# Patient Record
Sex: Male | Born: 1975 | Race: White | Hispanic: No | Marital: Married | Smoking: Former smoker
Health system: Southern US, Community
[De-identification: ages and names within clinical notes are randomized; demographics above are authoritative.]

## PROBLEM LIST (undated history)

## (undated) DIAGNOSIS — I10 Essential (primary) hypertension: Secondary | ICD-10-CM

## (undated) DIAGNOSIS — E079 Disorder of thyroid, unspecified: Secondary | ICD-10-CM

## (undated) HISTORY — PX: CERVICAL FUSION: SHX112

---

## 2007-12-13 ENCOUNTER — Encounter: Payer: Self-pay | Admitting: Orthopedic Surgery

## 2008-01-03 ENCOUNTER — Encounter: Payer: Self-pay | Admitting: Orthopedic Surgery

## 2008-12-07 ENCOUNTER — Observation Stay: Payer: Self-pay | Admitting: Cardiovascular Disease

## 2010-02-02 ENCOUNTER — Ambulatory Visit: Payer: Self-pay | Admitting: Family Medicine

## 2010-02-02 DIAGNOSIS — M109 Gout, unspecified: Secondary | ICD-10-CM

## 2010-02-02 DIAGNOSIS — F1010 Alcohol abuse, uncomplicated: Secondary | ICD-10-CM | POA: Insufficient documentation

## 2010-02-02 DIAGNOSIS — F329 Major depressive disorder, single episode, unspecified: Secondary | ICD-10-CM

## 2010-02-02 DIAGNOSIS — I10 Essential (primary) hypertension: Secondary | ICD-10-CM | POA: Insufficient documentation

## 2010-02-02 DIAGNOSIS — K219 Gastro-esophageal reflux disease without esophagitis: Secondary | ICD-10-CM

## 2010-02-02 DIAGNOSIS — F17200 Nicotine dependence, unspecified, uncomplicated: Secondary | ICD-10-CM

## 2010-02-03 LAB — CONVERTED CEMR LAB
CO2: 22 meq/L (ref 19–32)
Calcium: 9.7 mg/dL (ref 8.4–10.5)
Sodium: 138 meq/L (ref 135–145)
Uric Acid, Serum: 5 mg/dL (ref 4.0–7.8)

## 2010-02-09 ENCOUNTER — Telehealth: Payer: Self-pay | Admitting: Family Medicine

## 2010-02-10 ENCOUNTER — Telehealth (INDEPENDENT_AMBULATORY_CARE_PROVIDER_SITE_OTHER): Payer: Self-pay | Admitting: Family Medicine

## 2010-09-03 NOTE — Progress Notes (Signed)
  Phone Note Call from Patient   Caller: Patient Reason for Call: Talk to Doctor Details of Complaint: The patient called and wanted something else for his gout symptoms, Dr. Thurmond Butts called in a prescription for Colchidine.  The patient went to pick it up and it was too expensive.  He wanted to know if the doctor would call in anther prescription for Indocin. Initial call taken by: Rosine Beat,  February 10, 2010 1:15 PM  Follow-up for Phone Call        called patient, left message to return call Follow-up by: Adella Hare LPN,  February 10, 2010 1:32 PM  Additional Follow-up for Phone Call Additional follow up Details #1::        this is no longer patient's phone number, he has moved, this is his fathers home# Additional Follow-up by: Adella Hare LPN,  February 11, 2010 1:34 PM    New/Updated Medications: INDOMETHACIN CAP 25MG  (INDOMETHACIN) TAKE 1-2 PO 3 TIMES A DAY W/FOOD PRN Prescriptions: INDOMETHACIN CAP 25MG  (INDOMETHACIN) TAKE 1-2 PO 3 TIMES A DAY W/FOOD PRN  #60 x 0   Entered and Authorized by:   Kathrynn Running MD   Signed by:   Kathrynn Running MD on 02/10/2010   Method used:   Electronically to        [facility] [address] (retail)       [address]       [place], [place]  [address]       Ph: [phone]       Fax: [phone]   RxID:   [id number]

## 2010-09-03 NOTE — Progress Notes (Signed)
  Phone Note Call from Patient   Reason for Call: Talk to Doctor Details of Complaint: He was seen last weekend with gout, and was prescribed Indomethacin, called and left message this am and stated the Indomethcin was not helping.  He was in alot of pain.  He is asking what should he do?? Initial call taken by: Rosine Beat,  February 09, 2010 11:25 AM    New/Updated Medications: COLCHICINE 0.6 MG TABS (COLCHICINE) sig 1 by mouth qday to 3x a day for gout . Reduce doseage if diarrhea  occurs. Prescriptions: COLCHICINE 0.6 MG TABS (COLCHICINE) sig 1 by mouth qday to 3x a day for gout . Reduce doseage if diarrhea  occurs.  #30 x 0   Entered and Authorized by:   Hassan Rowan MD   Signed by:   Hassan Rowan MD on 02/09/2010   Method used:   Electronically to        [facility] [address] (retail)       [address]       [place], [place]  [address]       Ph: [phone]       Fax: [phone]   RxID:   [id number] COLCHICINE 0.6 MG TABS (COLCHICINE) sig 1 by mouth qday to 3x a day for gout . Reduce doseage if diarrhea  occurs.  #30 x 0   Entered and Authorized by:   Hassan Rowan MD   Signed by:   Hassan Rowan MD on 02/09/2010   Method used:   Electronically to        [facility] [address] (retail)       [address]       [place], [place]  [address]       Ph: [phone]       Fax: [phone]   RxID:   [id number]  Patient may need to be reevaluated if not better in one week. He was given instructions then to check w/PCP . Two options 1 to be reevaluated here or w/PCP. Or we can try colchine w/ the indocin to help the gout.

## 2010-09-03 NOTE — Assessment & Plan Note (Signed)
Summary: INJURED LEFT FOOT/EVM   Vital Signs:  Patient Profile:   35 Years Old Male CC:      wednesday Left foot started hurting// can not remember injury Height:     74 inches Weight:      214 pounds BMI:     27.58 Temp:     98.6 degrees F oral Pulse rate:   80 / minute Pulse rhythm:   regular Resp:     16 per minute BP sitting:   144 / 86  (left arm) Cuff size:   large  Vitals Entered By: Providence Crosby LPN (February 03, 6212 12:07 PM)                  Current Allergies: No known allergies History of Present Illness History from: patient Reason for visit: left foot pain Chief Complaint: wednesday Left foot started hurting// no njury History of Present Illness: Pain started wednesday with some swelling and redness, pain and swelling is getting worse.  Pt reports that he is having a hard time walking because of pain in the 1st toe joint in the left foot.  Pt says it hurts to put even a small amount of pressure on the foot.  Pt reports that he has had no fever chills, no injury to the foot, no nausea or vomiting.  He reports that he took some naproxen and it helped some with the pain. Pt in a lot of pain today. That is why he came in to be evaluated. He says that he is concerned about it getting worse over the next 2 days. the patient reports that was no particular injury that occurred on the foot. He reports that he wasn't sure that he needed to come in immediately but because the symptoms have gotten worse he wanted to have this evaluation.  the patient says that he does drink a lot of alcohol but he's not an alcoholic and he is able to stop whenever he needs to. He says that his family drinks a lot of alcohol and he grew up around excess consumption of alcohol. He says that he is not dependent on alcohol.  Current Problems: NICOTINE ADDICTION (ICD-305.1) DEHYDRATION (ICD-276.51) DEPRESSION (ICD-311) GERD (ICD-530.81) HYPERTENSION (ICD-401.9) DEPRESSION (ICD-311) UNSPECIFIED ESSENTIAL  HYPERTENSION (ICD-401.9) ACUTE GOUTY ARTHROPATHY (ICD-274.01) ALCOHOL ABUSE (ICD-305.00)   Current Meds ZANTAC 150 MG TABS (RANITIDINE HCL) 1 two times a day  by mouth ATENOLOL 100 MG TABS (ATENOLOL) take 1 by mouth daily HYDROCHLOROTHIAZIDE 25 MG TABS (HYDROCHLOROTHIAZIDE) takes 10 mg daily per patient by mouth FLUOXETINE HCL 40 MG CAPS (FLUOXETINE HCL) 1 daily by mouth INDOMETHACIN 50 MG CAPS (INDOMETHACIN)   REVIEW OF SYSTEMS Constitutional Symptoms      Denies fever, chills, night sweats, weight loss, weight gain, and fatigue.  Eyes       Complains of eye surgery.      Denies change in vision, eye pain, eye discharge, glasses, and contact lenses. Ear/Nose/Throat/Mouth       Denies hearing loss/aids, change in hearing, ear pain, ear discharge, dizziness, frequent runny nose, frequent nose bleeds, sinus problems, sore throat, hoarseness, and tooth pain or bleeding.  Respiratory       Denies dry cough, productive cough, wheezing, shortness of breath, asthma, bronchitis, and emphysema/COPD.  Cardiovascular       Complains of chest pain.      Denies murmurs and tires easily with exhertion.      Comments: chest pain in the past   Gastrointestinal  Complains of indigestion.      Denies stomach pain, nausea/vomiting, diarrhea, constipation, and blood in bowel movements. Genitourniary       Denies painful urination, blood or discharge from penis, kidney stones, and loss of urinary control. Neurological       Complains of tingling and tremors.      Denies paralysis, seizures, and fainting/blackouts. Musculoskeletal       Complains of gout.      Denies muscle pain, joint pain, joint stiffness, decreased range of motion, redness, swelling, and muscle weakness.      Comments: maybe per patient Skin       Denies bruising, unusual mles/lumps or sores, and hair/skin or nail changes.  Psych       Complains of anxiety/stress and depression.      Denies mood changes, temper/anger issues,  speech problems, and sleep problems.  Past History:  Family History: Last updated: 02/02/2010 Mother: alive 71yoa Lung Cancer Father: alive at 20 open heart surgery Brother: alive and well  Social History: Last updated: 02/02/2010 Marital Status: Married Children:  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 CANCER Removal: 2003 Lasik 01/12/2002  Past Medical History: Depression Hypertension heavy alcohol use chronic active nicotine dependence GERD History of skin cancer  Past Surgical History: skin cancer removal 2003 LASIK 2011  Family History: Mother: alive 77yoa Lung Cancer Father: alive at 43 open heart surgery Brother: alive and well  Social History: Marital Status: Married Children:  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 CANCER Removal: 2003 Lasik 01/12/2002 Physical Exam General appearance: well developed, well nourished, no acute distress Head: normocephalic, atraumatic Eyes: conjunctivae and lids normal Pupils: equal, round, reactive to light Ears: normal, no lesions or deformities Nasal: mucosa pink, nonedematous, no septal deviation, turbinates normal Oral/Pharynx: tongue normal, posterior pharynx without erythema or exudate Neck: neck supple,  trachea midline, no masses Chest/Lungs: no rales, wheezes, or rhonchi bilateral, breath sounds equal without effort Heart: regular rate and  rhythm, no murmur Abdomen: soft, non-tender without obvious organomegaly Extremities: patient has a severely swollen red and tender left first MCP joint, only a minimal amount of movement of the right first toe because of tenderness. He also has severe pain and tenderness on the pad of the first toe. No onychomycosis. Neurological: patient is walking with a limp, cannot bear weight on the left foot. Otherwise neurological exam normal Skin: redness and irritation and inflammation and heat in the left first toe MSE: oriented to time, place, and person Assessment Problems:     Assessed ACUTE GOUTY ARTHROPATHY as new - Standley Dakins MD New Problems: NICOTINE ADDICTION (ICD-305.1) DEHYDRATION (ICD-276.51) DEPRESSION (ICD-311) GERD (ICD-530.81) HYPERTENSION (ICD-401.9) DEPRESSION (ICD-311) UNSPECIFIED ESSENTIAL HYPERTENSION (ICD-401.9) ACUTE GOUTY ARTHROPATHY (ICD-274.01) ALCOHOL ABUSE (ICD-305.00)   Patient Education: Patient and/or caregiver instructed in the following: rest, quit smoking. please cut down alcohol usage significantly-please cut at least 50% of alcohol consumption. Demonstrates willingness to comply.  Plan New Medications/Changes: INDOCIN 50 MG SUPP (INDOMETHACIN) take 1 by mouth three times a day as needed severe gout pain, take with food and drink lots of water  #18 x 0, 02/02/2010, Standley Dakins MD  New Orders: New Patient Level II [99202] Basic Met-[facility] [phone] Uric Acid-[facility] [22025-42706] Follow Up: Follow up in 1-2 days if no improvement, Follow up on an as needed basis, Follow up with Primary Physician  The patient and/or caregiver has been counseled thoroughly with regard to medications prescribed including dosage, schedule, interactions, rationale for use, and possible side effects and they verbalize  understanding.  Diagnoses and expected course of recovery discussed and will return if not improved as expected or if the condition worsens. Patient and/or caregiver verbalized understanding.  Prescriptions: INDOCIN 50 MG SUPP (INDOMETHACIN) take 1 by mouth three times a day as needed severe gout pain, take with food and drink lots of water  #18 x 0   Entered and Authorized by:   Standley Dakins MD   Signed by:   Standley Dakins MD on 02/02/2010   Method used:   Electronically to        [facility] [address])       [address], [address]       [place], [place]  [address]       Ph: [phone]       Fax: [phone]   RxID:   [id number]  The patient was actually given  a prescription for the capsules. I was called the pharmacy and to make sure that they gave him capsules and not suppositories.  Patient Instructions: 1)  Stop the HCTZ completely until your gout gets better and check with your PCP before restarting.  2)  Cut down on alcohol consumption by at least 50%. 3)  Avoid Aspirin, liver and foods that increase uric acid levels. 4)  Drink extra water because you are dehydrated 5)  Stop taking indocin as soon as you get better. Don't take the indocin for longer than 3 days.   Orders Added: 1)  New Patient Level II [99202] 2)  Basic Met[facility] [84132-44010] 3)  Uric Acid[facility] [27253-66440]  The risks, benefits and possible side effects were clearly explained and discussed with the patient.  The patient verbalized clear understanding.  The patient was given instructions to return if symptoms don't improve, worsen or new changes develop.  If it is not during clinic hours and the patient cannot get back to this clinic then the patient was told to seek medical care at an available urgent care or emergency department.  The patient verbalized understanding.    The patient was informed that there is no on-call provider or services available at this clinic during off-hours (when the clinic is closed).  If the patient developed a problem or concern that required immediate attention, the patient was advised to go the the nearest available urgent care or emergency department for medical care.  The patient verbalized understanding.     It was clearly explained to the patient that this [facility] is not intended to be a primary care clinic.  The patient is always better served by the continuity of care and the provider/patient relationships developed with their dedicated primary care provider.  The patient was told to be sure to follow up as soon as possible with their primary care provider to discuss treatments received and to receive further examination and testing.  The  patient verbalized understanding. The will f/u with PCP ASAP.   The patient's prescriptions were checked for possible interactions and electronically sent to the pharmacy of choice.

## 2012-07-25 ENCOUNTER — Emergency Department: Payer: Self-pay | Admitting: Emergency Medicine

## 2012-07-25 LAB — URINALYSIS, COMPLETE
Bacteria: NONE SEEN
Bilirubin,UR: NEGATIVE
Blood: NEGATIVE
Glucose,UR: NEGATIVE mg/dL (ref 0–75)
Ketone: NEGATIVE
Nitrite: NEGATIVE
Specific Gravity: 1.017 (ref 1.003–1.030)
WBC UR: 1 /HPF (ref 0–5)

## 2012-07-27 LAB — AFP TUMOR MARKER: AFP-Tumor Marker: 3 ng/mL (ref 0.0–8.3)

## 2013-05-17 ENCOUNTER — Ambulatory Visit: Payer: Self-pay | Admitting: Family Medicine

## 2014-01-06 DIAGNOSIS — M5412 Radiculopathy, cervical region: Secondary | ICD-10-CM | POA: Insufficient documentation

## 2014-01-06 DIAGNOSIS — M7580 Other shoulder lesions, unspecified shoulder: Secondary | ICD-10-CM | POA: Insufficient documentation

## 2014-01-19 ENCOUNTER — Ambulatory Visit: Payer: Self-pay | Admitting: Nurse Practitioner

## 2014-02-15 ENCOUNTER — Encounter: Payer: Self-pay | Admitting: Orthopedic Surgery

## 2014-03-04 ENCOUNTER — Encounter: Payer: Self-pay | Admitting: Orthopedic Surgery

## 2014-06-06 DIAGNOSIS — M5032 Other cervical disc degeneration, mid-cervical region, unspecified level: Secondary | ICD-10-CM | POA: Insufficient documentation

## 2014-06-20 ENCOUNTER — Encounter: Payer: Self-pay | Admitting: Orthopedic Surgery

## 2014-07-04 ENCOUNTER — Encounter: Payer: Self-pay | Admitting: Orthopedic Surgery

## 2015-07-16 ENCOUNTER — Ambulatory Visit: Payer: 59

## 2015-07-16 ENCOUNTER — Encounter: Payer: Self-pay | Admitting: Emergency Medicine

## 2015-07-16 ENCOUNTER — Ambulatory Visit
Admission: EM | Admit: 2015-07-16 | Discharge: 2015-07-16 | Disposition: A | Discharge status: Home / Self Care | Payer: 59 | Attending: Family Medicine | Admitting: Family Medicine

## 2015-07-16 ENCOUNTER — Ambulatory Visit (INDEPENDENT_AMBULATORY_CARE_PROVIDER_SITE_OTHER): Payer: 59

## 2015-07-16 DIAGNOSIS — S20211A Contusion of right front wall of thorax, initial encounter: Secondary | ICD-10-CM

## 2015-07-16 HISTORY — DX: Essential (primary) hypertension: I10

## 2015-07-16 MED ORDER — IBUPROFEN 800 MG PO TABS: 800.0000 mg | ORAL_TABLET | Freq: Three times a day (TID) | ORAL | Status: DC | PRN

## 2015-07-16 MED ORDER — OXYCODONE-ACETAMINOPHEN 5-325 MG PO TABS: 1.0000 | ORAL_TABLET | Freq: Three times a day (TID) | ORAL | Status: DC | PRN

## 2015-07-16 NOTE — ED Notes (Signed)
Patient states that he was wrestling with his wife for fun on Saturday and felt a pop on his right ribcage.  Patient reports pain when he coughs or takes a deep breath.

## 2015-07-16 NOTE — ED Provider Notes (Signed)
[facility]  ____________________________________________  Time seen: Approximately 9:07 AM  I have reviewed the triage vital signs and the nursing notes.   HISTORY  Chief Complaint Rib Pain    HPI Shawn Wilson is a 39 y.o. male presents for complaints of right lateral rib pain. Patient reports that Saturday afternoon he was wrestling with his wife "goofing off "and states that his right rib accidentally hit her knee. States that he heard a pop and has had pain present to that area since. Denies other injury. Denies fall. Denies head injury or loss consciousness.  States current right lateral rib pain is 1 out of 10 however with movement, cough, sneeze, deep breath pain is 7 out of 10 sharp. Denies pain radiation. States pain is fully reproducible by touch.  Denies shortness of breath, cough, fever, abdominal pain, neck or back pain.  PCP: Clemmie Krill  Past Medical History  Diagnosis Date  . Hypertension     Patient Active Problem List   Diagnosis Date Noted  . ACUTE GOUTY ARTHROPATHY 02/02/2010  . ALCOHOL ABUSE 02/02/2010  . NICOTINE ADDICTION 02/02/2010  . DEPRESSION 02/02/2010  . Unspecified essential hypertension 02/02/2010  . GERD 02/02/2010    Past Surgical History  Procedure Laterality Date  . Cervical fusion      Current Outpatient Rx  Name  Route  Sig  Dispense  Refill  . lisinopril (PRINIVIL,ZESTRIL) 20 MG tablet   Oral   Take 20 mg by mouth daily.           Allergies Review of patient's allergies indicates no known allergies.  History reviewed. No pertinent family history.  Social History Social History  Substance Use Topics  . Smoking status: Former Research scientist (life sciences)  . Smokeless tobacco: None  . Alcohol Use: Yes    Review of Systems Constitutional: No fever/chills Eyes: No visual changes. ENT: No sore throat. Cardiovascular: Denies chest pain. Respiratory: Denies shortness of breath. Gastrointestinal: No abdominal pain.  No nausea, no  vomiting.  No diarrhea.  No constipation. Genitourinary: Negative for dysuria. Musculoskeletal: Negative for back pain. right lateral rib pain Skin: Negative for rash. Neurological: Negative for headaches, focal weakness or numbness.  10-point ROS otherwise negative.  ____________________________________________   PHYSICAL EXAM:  VITAL SIGNS: ED Triage Vitals  Enc Vitals Group     BP 07/16/15 0848 138/76 mmHg     Pulse Rate 07/16/15 0848 74     Resp 07/16/15 0848 16     Temp 07/16/15 0848 96.7 F (35.9 C)     Temp Source 07/16/15 0848 Tympanic     SpO2 07/16/15 0848 100 %     Weight 07/16/15 0848 200 lb (90.719 kg)     Height 07/16/15 0848 6\' 2"  (1.88 m)     Head Cir --      Peak Flow --      Pain Score 07/16/15 0850 7     Pain Loc --      Pain Edu? --      Excl. in [place]? --     Constitutional: Alert and oriented. Well appearing and in no acute distress. Eyes: Conjunctivae are normal. PERRL. EOMI. Head: Atraumatic  Nose: No congestion/rhinnorhea.  Mouth/Throat: Mucous membranes are moist.  Oropharynx non-erythematous. Neck: No stridor.  No cervical spine tenderness to palpation. Hematological/Lymphatic/Immunilogical: No cervical lymphadenopathy. Cardiovascular: Normal rate, regular rhythm. Grossly normal heart sounds.  Good peripheral circulation. Respiratory: Normal respiratory effort.  No retractions. Lungs CTAB. no wheezes, rales or rhonchi. Good air movement.  Gastrointestinal: Soft and nontender. No distention. Normal Bowel sounds.  Musculoskeletal: No lower or upper extremity tenderness nor edema.  No joint effusions. Bilateral pedal pulses equal and easily palpated.  No cervical, thoracic or lumbar tenderness to palpation. Changes position from lying to standing quickly. Right lateral rib midaxillary line moderate tenderness to palpation over right fourth through seventh ribs, no ecchymosis, no swelling. No palpable deformity. Chest otherwise nontender. Neurologic:   Normal speech and language. No gross focal neurologic deficits are appreciated. No gait instability. Skin:  Skin is warm, dry and intact. No rash noted. Psychiatric: Mood and affect are normal. Speech and behavior are normal.  ____________________________________________   LABS (all labs ordered are listed, but only abnormal results are displayed)  Labs Reviewed - No data to display  RADIOLOGY  RIGHT RIBS AND CHEST - 3+ VIEW  COMPARISON: None.  FINDINGS: No fracture or other bone lesions are seen involving the ribs. There is no evidence of pneumothorax or pleural effusion. Both lungs are clear. Heart size and mediastinal contours are within normal limits.  IMPRESSION: Negative.   Electronically Signed By: Kathreen Devoid On: 07/16/2015 09:47  I, Marylene Land, personally viewed and evaluated these images (plain radiographs) as part of my medical decision making.    INITIAL IMPRESSION / ASSESSMENT AND PLAN / ED COURSE  Pertinent labs & imaging results that were available during my care of the patient were reviewed by me and considered in my medical decision making (see chart for details).  Very well-appearing patient. No acute distress. Presents for the complaint of right lateral rib pain post injury Saturday. Denies pain prior to injury. States pain was immediately after onset of hitting right rib on wife's knee. Denies other pain or injury. Pain fully reproducible by direct palpation per patient. Lungs clear throughout. Abdomen soft and nontender. Will evaluate by right rib series.  Rib xray negative per radiologist. Counseled regarding ice, rest, deep breaths throughout day frequently, avoid reinjury. PRN ibuprofen and percocet qty 9. Work note given. Counseled if pain persists then repeat imaging in 10-14 days.   Discussed follow up with Primary care physician this week. Discussed follow up and return parameters including no resolution or any worsening concerns.  Patient verbalized understanding and agreed to plan.   ____________________________________________   FINAL CLINICAL IMPRESSION(S) / ED DIAGNOSES  Final diagnoses:  Rib contusion, right, initial encounter       Marylene Land, NP 07/16/15 1150

## 2015-07-16 NOTE — Discharge Instructions (Signed)
Take medication as prescribed. Rest. Apply ice. Take deep breaths multiple times per day as discussed, to help with lung expansion.   Follow up with your primary care physician this week as needed. Return to Urgent care as needed for new or worsening concerns.   Rib Contusion A rib contusion is a deep bruise on your rib area. Contusions are the result of a blunt trauma that causes bleeding and injury to the tissues under the skin. A rib contusion may involve bruising of the ribs and of the skin and muscles in the area. The skin overlying the contusion may turn blue, purple, or yellow. Minor injuries will give you a painless contusion, but more severe contusions may stay painful and swollen for a few weeks. CAUSES  A contusion is usually caused by a blow, trauma, or direct force to an area of the body. This often occurs while playing contact sports. SYMPTOMS  Swelling and redness of the injured area.  Discoloration of the injured area.  Tenderness and soreness of the injured area.  Pain with or without movement. DIAGNOSIS  The diagnosis can be made by taking a medical history and performing a physical exam. An X-ray, CT scan, or MRI may be needed to determine if there were any associated injuries, such as broken bones (fractures) or internal injuries. TREATMENT  Often, the best treatment for a rib contusion is rest. Icing or applying cold compresses to the injured area may help reduce swelling and inflammation. Deep breathing exercises may be recommended to reduce the risk of partial lung collapse and pneumonia. Over-the-counter or prescription medicines may also be recommended for pain control. HOME CARE INSTRUCTIONS   Apply ice to the injured area:  Put ice in a plastic bag.  Place a towel between your skin and the bag.  Leave the ice on for 20 minutes, 2-3 times per day.  Take medicines only as directed by your health care provider.  Rest the injured area. Avoid strenuous activity  and any activities or movements that cause pain. Be careful during activities and avoid bumping the injured area.  Perform deep-breathing exercises as directed by your health care provider.  Do not lift anything that is heavier than 5 lb (2.3 kg) until your health care provider approves.  Do not use any tobacco products, including cigarettes, chewing tobacco, or electronic cigarettes. If you need help quitting, ask your health care provider. SEEK MEDICAL CARE IF:   You have increased bruising or swelling.  You have pain that is not controlled with treatment.  You have a fever. SEEK IMMEDIATE MEDICAL CARE IF:   You have difficulty breathing or shortness of breath.  You develop a continual cough, or you cough up thick or bloody sputum.  You feel sick to your stomach (nauseous), you throw up (vomit), or you have abdominal pain.   This information is not intended to replace advice given to you by your health care provider. Make sure you discuss any questions you have with your health care provider.   Document Released: 04/15/2001 Document Revised: 08/11/2014 Document Reviewed: 05/02/2014 Elsevier Interactive Patient Education [employer].

## 2015-07-22 ENCOUNTER — Ambulatory Visit (INDEPENDENT_AMBULATORY_CARE_PROVIDER_SITE_OTHER): Payer: 59

## 2015-07-22 ENCOUNTER — Encounter: Payer: Self-pay | Admitting: Gynecology

## 2015-07-22 ENCOUNTER — Ambulatory Visit
Admission: EM | Admit: 2015-07-22 | Discharge: 2015-07-22 | Disposition: A | Discharge status: Home / Self Care | Payer: 59 | Attending: Family Medicine | Admitting: Family Medicine

## 2015-07-22 DIAGNOSIS — S2231XS Fracture of one rib, right side, sequela: Secondary | ICD-10-CM

## 2015-07-22 MED ORDER — NAPROXEN 500 MG PO TABS: 500.0000 mg | ORAL_TABLET | Freq: Two times a day (BID) | ORAL | Status: DC

## 2015-07-22 MED ORDER — CYCLOBENZAPRINE HCL 5 MG PO TABS: ORAL_TABLET | ORAL | Status: DC

## 2015-07-22 NOTE — ED Notes (Signed)
Patient c/o follow up visit x 1 week for rib pain. Per patient pain is not better.

## 2015-07-22 NOTE — ED Provider Notes (Signed)
Patient presents today for follow-up regarding rib injury a week ago. Patient states that his symptoms have not improved much. He is still having pain with movement and coughing or laughing. The injury occurred due to a knee to the right rib area from his wife's knee while "goofing off." He denies any chest pain, shortness of breath, fever, productive cough. He has been taking ibuprofen and Percocet.  ROS: Negative except mentioned above.  Vitals: as per Epic. GENERAL: NAD HEENT: no pharyngeal erythema, no exudate RESP: CTA B, mild tenderness of right lower anterior lateral rib area, no step-off, no ecchymosis appreciated   CARD: RRR  A/P: Nondisplaced right eighth rib fracture-this was not seen on previous x-ray that was done a few days ago, this was explained to the patient, will prescribe Naprosyn and Flexeril when necessary, ice when necessary, advised taking deep breaths in and out, we'll give patient work excuse for 2 days, if a longer period of time as needed he will need to follow-up with his primary care physician. If any acute problems he is to seek medical attention.   Paulina Fusi, MD 07/22/15 [phone]

## 2016-12-10 ENCOUNTER — Ambulatory Visit: Admission: EM | Admit: 2016-12-10 | Discharge: 2016-12-10 | Discharge status: Absent without leave | Payer: 59

## 2016-12-15 ENCOUNTER — Ambulatory Visit: Admission: EM | Admit: 2016-12-15 | Discharge: 2016-12-15 | Discharge status: Absent without leave | Payer: 59

## 2016-12-16 ENCOUNTER — Ambulatory Visit
Admission: EM | Admit: 2016-12-16 | Discharge: 2016-12-16 | Disposition: A | Discharge status: Home / Self Care | Payer: 59 | Attending: Family Medicine | Admitting: Family Medicine

## 2016-12-16 ENCOUNTER — Encounter: Payer: Self-pay | Admitting: *Deleted

## 2016-12-16 DIAGNOSIS — Z0289 Encounter for other administrative examinations: Secondary | ICD-10-CM

## 2016-12-16 DIAGNOSIS — Z024 Encounter for examination for driving license: Secondary | ICD-10-CM

## 2016-12-16 HISTORY — DX: Disorder of thyroid, unspecified: E07.9

## 2016-12-16 LAB — DEPT OF TRANSP DIPSTICK, URINE (ARMC ONLY)
GLUCOSE, UA: NEGATIVE mg/dL
HGB URINE DIPSTICK: NEGATIVE
PROTEIN: NEGATIVE mg/dL
SPECIFIC GRAVITY, URINE: 1.015 (ref 1.005–1.030)

## 2016-12-16 NOTE — ED Provider Notes (Signed)
[facility]    CSN: [id number] Arrival date & time: 12/16/16  0802     History   Chief Complaint Chief Complaint  Patient presents with  . Commercial Driver's License Exam    HPI Shawn Wilson is a 41 y.o. male.   Patient here for DOT Physical (see scanned form)   The history is provided by the patient.    Past Medical History:  Diagnosis Date  . Hypertension   . Thyroid disease     Patient Active Problem List   Diagnosis Date Noted  . ACUTE GOUTY ARTHROPATHY 02/02/2010  . ALCOHOL ABUSE 02/02/2010  . NICOTINE ADDICTION 02/02/2010  . DEPRESSION 02/02/2010  . Unspecified essential hypertension 02/02/2010  . GERD 02/02/2010    Past Surgical History:  Procedure Laterality Date  . CERVICAL FUSION         Home Medications    Prior to Admission medications   Medication Sig Start Date End Date Taking? Authorizing Provider  lisinopril (PRINIVIL,ZESTRIL) 20 MG tablet Take 20 mg by mouth daily.   Yes [provider]  naproxen (NAPROSYN) 500 MG tablet Take 1 tablet (500 mg total) by mouth 2 (two) times daily. 07/22/15  Yes Paulina Fusi, MD  cyclobenzaprine (FLEXERIL) 5 MG tablet Take one tab PO q12hrs prn muscle spasm. Can cause drowsiness. 07/22/15   Paulina Fusi, MD  ibuprofen (ADVIL,MOTRIN) 800 MG tablet Take 1 tablet (800 mg total) by mouth every 8 (eight) hours as needed for mild pain or moderate pain. 07/16/15   Marylene Land, NP  oxyCODONE-acetaminophen (ROXICET) 5-325 MG tablet Take 1 tablet by mouth every 8 (eight) hours as needed for moderate pain or severe pain (Do not drive or operate heavy machinery while taking as can cause drowsiness.). 07/16/15   Marylene Land, NP    Family History History reviewed. No pertinent family history.  Social History Social History  Substance Use Topics  . Smoking status: Former Research scientist (life sciences)  . Smokeless tobacco: Never Used  . Alcohol use Yes     Allergies   Patient has no known  allergies.   Review of Systems Review of Systems   Physical Exam Triage Vital Signs ED Triage Vitals  Enc Vitals Group     BP 12/16/16 0836 136/79     Pulse Rate 12/16/16 0836 87     Resp 12/16/16 0836 16     Temp 12/16/16 0836 98.7 F (37.1 C)     Temp Source 12/16/16 0836 Oral     SpO2 12/16/16 0836 100 %     Weight 12/16/16 0837 190 lb (86.2 kg)     Height 12/16/16 0837 6\' 2"  (1.88 m)     Head Circumference --      Peak Flow --      Pain Score 12/16/16 0837 0     Pain Loc --      Pain Edu? --      Excl. in [place]? --    No data found.   Updated Vital Signs BP 136/79 (BP Location: Left Arm)   Pulse 87   Temp 98.7 F (37.1 C) (Oral)   Resp 16   Ht 6\' 2"  (1.88 m)   Wt 190 lb (86.2 kg)   SpO2 100%   BMI 24.39 kg/m   Visual Acuity Right Eye Distance:   Left Eye Distance:   Bilateral Distance:    Right Eye Near:   Left Eye Near:    Bilateral Near:     Physical Exam  UC Treatments / Results  Labs (all labs ordered are listed, but only abnormal results are displayed) Labs Reviewed  DEPT OF TRANSP DIPSTICK, URINE([facility] ONLY)    EKG  EKG Interpretation None       Radiology No results found.  Procedures Procedures (including critical care time)  Medications Ordered in UC Medications - No data to display   Initial Impression / Assessment and Plan / UC Course  I have reviewed the triage vital signs and the nursing notes.  Pertinent labs & imaging results that were available during my care of the patient were reviewed by me and considered in my medical decision making (see chart for details).       Final Clinical Impressions(s) / UC Diagnoses   Final diagnoses:  Encounter for examination required by Department of Transportation (DOT)    New Prescriptions New Prescriptions   No medications on file   DOT Physical (medically qualified for 1 year; see scanned form)   Norval Gable, MD 12/16/16 [phone]

## 2016-12-16 NOTE — ED Triage Notes (Signed)
Commercial drivers license physical exam 

## 2018-04-21 ENCOUNTER — Ambulatory Visit
Admission: RE | Admit: 2018-04-21 | Discharge: 2018-04-21 | Disposition: A | Discharge status: Home / Self Care | Payer: BLUE CROSS/BLUE SHIELD | Source: Ambulatory Visit | Attending: Family Medicine | Admitting: Family Medicine

## 2018-04-21 ENCOUNTER — Other Ambulatory Visit: Payer: Self-pay | Admitting: Family Medicine

## 2018-04-21 DIAGNOSIS — R0600 Dyspnea, unspecified: Secondary | ICD-10-CM | POA: Insufficient documentation

## 2018-04-21 DIAGNOSIS — M546 Pain in thoracic spine: Secondary | ICD-10-CM | POA: Insufficient documentation

## 2019-01-25 ENCOUNTER — Other Ambulatory Visit: Payer: Self-pay | Admitting: Family Medicine

## 2019-01-25 DIAGNOSIS — R1013 Epigastric pain: Secondary | ICD-10-CM

## 2019-01-31 ENCOUNTER — Encounter (INDEPENDENT_AMBULATORY_CARE_PROVIDER_SITE_OTHER): Payer: Self-pay

## 2019-01-31 ENCOUNTER — Ambulatory Visit
Admission: RE | Admit: 2019-01-31 | Discharge: 2019-01-31 | Disposition: A | Discharge status: Home / Self Care | Payer: BC Managed Care – PPO | Source: Ambulatory Visit | Attending: Family Medicine | Admitting: Family Medicine

## 2019-01-31 ENCOUNTER — Other Ambulatory Visit: Payer: Self-pay

## 2019-01-31 DIAGNOSIS — R1013 Epigastric pain: Secondary | ICD-10-CM | POA: Insufficient documentation

## 2019-02-02 ENCOUNTER — Encounter: Payer: Self-pay | Admitting: Surgery

## 2019-02-02 ENCOUNTER — Other Ambulatory Visit: Payer: Self-pay

## 2019-02-02 ENCOUNTER — Ambulatory Visit: Payer: Self-pay | Admitting: Surgery

## 2019-02-02 ENCOUNTER — Encounter: Payer: Self-pay | Admitting: *Deleted

## 2019-02-02 ENCOUNTER — Other Ambulatory Visit
Admission: RE | Admit: 2019-02-02 | Discharge: 2019-02-02 | Disposition: A | Discharge status: Home / Self Care | Payer: BC Managed Care – PPO | Source: Ambulatory Visit | Attending: Surgery | Admitting: Surgery

## 2019-02-02 VITALS — BP 176/127 | HR 78 | Temp 97.5°F | Ht 74.0 in | Wt 212.0 lb

## 2019-02-02 DIAGNOSIS — R12 Heartburn: Secondary | ICD-10-CM | POA: Diagnosis not present

## 2019-02-02 DIAGNOSIS — K8000 Calculus of gallbladder with acute cholecystitis without obstruction: Secondary | ICD-10-CM | POA: Diagnosis not present

## 2019-02-02 DIAGNOSIS — Z1159 Encounter for screening for other viral diseases: Secondary | ICD-10-CM | POA: Diagnosis not present

## 2019-02-02 DIAGNOSIS — Z87891 Personal history of nicotine dependence: Secondary | ICD-10-CM | POA: Diagnosis not present

## 2019-02-02 DIAGNOSIS — K810 Acute cholecystitis: Secondary | ICD-10-CM | POA: Diagnosis present

## 2019-02-02 DIAGNOSIS — K811 Chronic cholecystitis: Secondary | ICD-10-CM | POA: Diagnosis not present

## 2019-02-02 DIAGNOSIS — I10 Essential (primary) hypertension: Secondary | ICD-10-CM | POA: Diagnosis not present

## 2019-02-02 DIAGNOSIS — Z79899 Other long term (current) drug therapy: Secondary | ICD-10-CM | POA: Diagnosis not present

## 2019-02-02 LAB — SARS CORONAVIRUS 2 BY RT PCR (HOSPITAL ORDER, PERFORMED IN ~~LOC~~ HOSPITAL LAB): SARS Coronavirus 2: NEGATIVE

## 2019-02-02 MED ORDER — CEFAZOLIN SODIUM-DEXTROSE 2-4 GM/100ML-% IV SOLN
2.0000 g | INTRAVENOUS | Status: AC
  Administered 2019-02-03: 2 g via INTRAVENOUS

## 2019-02-02 NOTE — H&P (View-Only) (Signed)
02/02/2019  Reason for Visit:  Cholecystitis  Referring Provider:  Laura Bliss, MD  History of Present Illness: Shawn Wilson is a 43 y.o. male presenting for evaluation of cholelithiasis and cholecystitis.  Patient reports starting to have pain in the epigastric and right upper quadrant about a week ago.  Associated with nausea but no emesis.  Denies having any fevers or chills.  At first, the pain was more intermittent and waxing/waning, but now is more constant at baseline with spikes of pain with po intake.  Has not had much po intake recently due to the pain.  He went to see his PCP who ordered labs and ultrasound.  Labs were overall unremarkable with mildly elevated alk phos, normal LFTs otherwise, and normal WBC, though at the high end of normal.  His ultrasound however, shows mild gallbladder wall thickening with cholelithiasis and possible gallbladder polyps.  He was tender over the gallbladder during the imaging study.  I have independently viewed the images and agree with the findings.  Reports hypertension and no abdominal surgeries.  Has history of heartburn and takes Nexium but he reports these symptoms are not his typical heartburn/indigestion symptoms.    Past Medical History: Past Medical History:  Diagnosis Date  . Hypertension   . Thyroid disease      Past Surgical History: Past Surgical History:  Procedure Laterality Date  . CERVICAL FUSION      Home Medications: Prior to Admission medications   Medication Sig Start Date End Date Taking? Authorizing Provider  esomeprazole (NEXIUM) 20 MG capsule Take 40 mg by mouth 2 (two) times a day.    [provider]  triamterene-hydrochlorothiazide (MAXZIDE) 75-50 MG tablet Take 1 tablet by mouth daily.    [provider]    Allergies: No Known Allergies  Social History:  reports that he has quit smoking. He has never used smokeless tobacco. He reports current alcohol use. He reports that he does not use  drugs.   Family History: History reviewed. No pertinent family history.  Review of Systems: Review of Systems  Constitutional: Negative for chills and fever.  HENT: Negative for hearing loss.   Respiratory: Negative for shortness of breath.   Cardiovascular: Negative for chest pain.  Gastrointestinal: Positive for abdominal pain and nausea. Negative for constipation, diarrhea and vomiting.  Genitourinary: Negative for dysuria.  Musculoskeletal: Negative for myalgias.  Neurological: Negative for dizziness.  Psychiatric/Behavioral: Negative for depression.    Physical Exam BP (!) 176/127   Pulse 78   Temp (!) 97.5 F (36.4 C) (Skin)   Ht 6' 2" (1.88 m)   Wt 212 lb (96.2 kg)   SpO2 98%   BMI 27.22 kg/m  CONSTITUTIONAL: No acute distress HEENT:  Normocephalic, atraumatic, extraocular motion intact. NECK: Trachea is midline, and there is no jugular venous distension.  RESPIRATORY:  Lungs are clear, and breath sounds are equal bilaterally. Normal respiratory effort without pathologic use of accessory muscles. CARDIOVASCULAR: Heart is regular without murmurs, gallops, or rubs. GI: The abdomen is soft, non-distended, with tenderness to palpation over epigastric and right upper quadrant.  Positive Murphy's sign.  MUSCULOSKELETAL:  Normal muscle strength and tone in all four extremities.  No peripheral edema or cyanosis. SKIN: Skin turgor is normal. There are no pathologic skin lesions.  NEUROLOGIC:  Motor and sensation is grossly normal.  Cranial nerves are grossly intact. PSYCH:  Alert and oriented to person, place and time. Affect is normal.  Laboratory Analysis: No results found for this   or any previous visit (from the past 24 hour(s)).  Imaging: Ultrasound 01/31/19: IMPRESSION: 1. The appearance of the gallbladder is concerning for acute cholecystitis. Specifically there are gallstones with gallbladder wall thickening and focal tenderness over the gallbladder. There is also  felt to be underlying inflammatory lesions such as adenomyomatosis or cholesterolosis.  2. Portions of pancreas obscured by gas. Visualized portions of pancreas appear unremarkable.  3.  Study otherwise unremarkable.  Assessment and Plan: This is a 43 y.o. male with cholecystitis.  Discussed with the patient that we can take him to the OR this afternoon for laparoscopic cholecystectomy.  However, he mentions that he does not have a ride and would rather schedule surgery for tomorrow.  At this point he's afebrile and will schedule for surgery tomorrow.  Discussed with him the risks of bleeding, infection, injury to surrounding structures, possibility for open procedure, and he's willing to proceed.  He understands that he needs to get tested for COVID-19 as part of the preop workup for the hospital.  Face-to-face time spent with the patient and care providers was 60 minutes, with more than 50% of the time spent counseling, educating, and coordinating care of the patient.     Jose Luis Piscoya, MD [facility]   

## 2019-02-02 NOTE — Progress Notes (Signed)
Patient's surgery to be scheduled for 02-03-19 at [facility] with Dr. Hampton Abbot.  The patient is aware to have COVID-19 testing done on  02-02-19 at the [place] building drive thru ([address] [place]) after he leaves the office today. He is aware to isolate after, have no visitors, wash hands frequently, and avoid touching face.   Patient aware to be NPO after midnight and have a driver.   He is aware to check in at the [place] entrance where he will be screened for the coronavirus and then sent to Same Day Surgery. Patient to arrive at the [place] day of surgery at 10:30 am.   Patient aware that he may have no visitors and driver will need to wait in the car due to COVID-19 restrictions.   The patient verbalizes understanding of the above.   The patient is aware to call the office should he have further questions.

## 2019-02-02 NOTE — Progress Notes (Signed)
02/02/2019  Reason for Visit:  Cholecystitis  Referring Provider:  Laura Bliss, MD  History of Present Illness: Shawn Wilson is a 42 y.o. male presenting for evaluation of cholelithiasis and cholecystitis.  Patient reports starting to have pain in the epigastric and right upper quadrant about a week ago.  Associated with nausea but no emesis.  Denies having any fevers or chills.  At first, the pain was more intermittent and waxing/waning, but now is more constant at baseline with spikes of pain with po intake.  Has not had much po intake recently due to the pain.  He went to see his PCP who ordered labs and ultrasound.  Labs were overall unremarkable with mildly elevated alk phos, normal LFTs otherwise, and normal WBC, though at the high end of normal.  His ultrasound however, shows mild gallbladder wall thickening with cholelithiasis and possible gallbladder polyps.  He was tender over the gallbladder during the imaging study.  I have independently viewed the images and agree with the findings.  Reports hypertension and no abdominal surgeries.  Has history of heartburn and takes Nexium but he reports these symptoms are not his typical heartburn/indigestion symptoms.    Past Medical History: Past Medical History:  Diagnosis Date  . Hypertension   . Thyroid disease      Past Surgical History: Past Surgical History:  Procedure Laterality Date  . CERVICAL FUSION      Home Medications: Prior to Admission medications   Medication Sig Start Date End Date Taking? Authorizing Provider  esomeprazole (NEXIUM) 20 MG capsule Take 40 mg by mouth 2 (two) times a day.    [provider]  triamterene-hydrochlorothiazide (MAXZIDE) 75-50 MG tablet Take 1 tablet by mouth daily.    [provider]    Allergies: No Known Allergies  Social History:  reports that he has quit smoking. He has never used smokeless tobacco. He reports current alcohol use. He reports that he does not use  drugs.   Family History: History reviewed. No pertinent family history.  Review of Systems: Review of Systems  Constitutional: Negative for chills and fever.  HENT: Negative for hearing loss.   Respiratory: Negative for shortness of breath.   Cardiovascular: Negative for chest pain.  Gastrointestinal: Positive for abdominal pain and nausea. Negative for constipation, diarrhea and vomiting.  Genitourinary: Negative for dysuria.  Musculoskeletal: Negative for myalgias.  Neurological: Negative for dizziness.  Psychiatric/Behavioral: Negative for depression.    Physical Exam BP (!) 176/127   Pulse 78   Temp (!) 97.5 F (36.4 C) (Skin)   Ht 6' 2" (1.88 m)   Wt 212 lb (96.2 kg)   SpO2 98%   BMI 27.22 kg/m  CONSTITUTIONAL: No acute distress HEENT:  Normocephalic, atraumatic, extraocular motion intact. NECK: Trachea is midline, and there is no jugular venous distension.  RESPIRATORY:  Lungs are clear, and breath sounds are equal bilaterally. Normal respiratory effort without pathologic use of accessory muscles. CARDIOVASCULAR: Heart is regular without murmurs, gallops, or rubs. GI: The abdomen is soft, non-distended, with tenderness to palpation over epigastric and right upper quadrant.  Positive Murphy's sign.  MUSCULOSKELETAL:  Normal muscle strength and tone in all four extremities.  No peripheral edema or cyanosis. SKIN: Skin turgor is normal. There are no pathologic skin lesions.  NEUROLOGIC:  Motor and sensation is grossly normal.  Cranial nerves are grossly intact. PSYCH:  Alert and oriented to person, place and time. Affect is normal.  Laboratory Analysis: No results found for this   or any previous visit (from the past 24 hour(s)).  Imaging: Ultrasound 01/31/19: IMPRESSION: 1. The appearance of the gallbladder is concerning for acute cholecystitis. Specifically there are gallstones with gallbladder wall thickening and focal tenderness over the gallbladder. There is also  felt to be underlying inflammatory lesions such as adenomyomatosis or cholesterolosis.  2. Portions of pancreas obscured by gas. Visualized portions of pancreas appear unremarkable.  3.  Study otherwise unremarkable.  Assessment and Plan: This is a 42 y.o. male with cholecystitis.  Discussed with the patient that we can take him to the OR this afternoon for laparoscopic cholecystectomy.  However, he mentions that he does not have a ride and would rather schedule surgery for tomorrow.  At this point he's afebrile and will schedule for surgery tomorrow.  Discussed with him the risks of bleeding, infection, injury to surrounding structures, possibility for open procedure, and he's willing to proceed.  He understands that he needs to get tested for COVID-19 as part of the preop workup for the hospital.  Face-to-face time spent with the patient and care providers was 60 minutes, with more than 50% of the time spent counseling, educating, and coordinating care of the patient.     Tekelia Kareem Luis Arush Gatliff, MD [facility]   

## 2019-02-03 ENCOUNTER — Ambulatory Visit
Admission: RE | Admit: 2019-02-03 | Discharge: 2019-02-03 | Disposition: A | Discharge status: Home / Self Care | Payer: BC Managed Care – PPO | Attending: Surgery | Admitting: Surgery

## 2019-02-03 ENCOUNTER — Ambulatory Visit: Payer: BC Managed Care – PPO | Admitting: Anesthesiology

## 2019-02-03 ENCOUNTER — Other Ambulatory Visit: Payer: Self-pay

## 2019-02-03 ENCOUNTER — Encounter
Admission: RE | Disposition: A | Discharge status: Home / Self Care | Payer: Self-pay | Source: Home / Self Care | Attending: Surgery

## 2019-02-03 ENCOUNTER — Encounter: Payer: Self-pay | Admitting: *Deleted

## 2019-02-03 DIAGNOSIS — Z87891 Personal history of nicotine dependence: Secondary | ICD-10-CM | POA: Insufficient documentation

## 2019-02-03 DIAGNOSIS — K811 Chronic cholecystitis: Secondary | ICD-10-CM | POA: Insufficient documentation

## 2019-02-03 DIAGNOSIS — R12 Heartburn: Secondary | ICD-10-CM | POA: Insufficient documentation

## 2019-02-03 DIAGNOSIS — Z79899 Other long term (current) drug therapy: Secondary | ICD-10-CM | POA: Insufficient documentation

## 2019-02-03 DIAGNOSIS — I10 Essential (primary) hypertension: Secondary | ICD-10-CM | POA: Insufficient documentation

## 2019-02-03 DIAGNOSIS — Z1159 Encounter for screening for other viral diseases: Secondary | ICD-10-CM | POA: Insufficient documentation

## 2019-02-03 DIAGNOSIS — K8000 Calculus of gallbladder with acute cholecystitis without obstruction: Secondary | ICD-10-CM | POA: Diagnosis not present

## 2019-02-03 HISTORY — PX: CHOLECYSTECTOMY: SHX55

## 2019-02-03 SURGERY — LAPAROSCOPIC CHOLECYSTECTOMY
Anesthesia: General | Site: Abdomen

## 2019-02-03 MED ORDER — MIDAZOLAM HCL 2 MG/2ML IJ SOLN
INTRAMUSCULAR | Status: AC
  Filled 2019-02-03: qty 2

## 2019-02-03 MED ORDER — PROPOFOL 10 MG/ML IV BOLUS
INTRAVENOUS | Status: DC | PRN
  Administered 2019-02-03: 140 mg via INTRAVENOUS

## 2019-02-03 MED ORDER — BUPIVACAINE-EPINEPHRINE (PF) 0.25% -1:200000 IJ SOLN
INTRAMUSCULAR | Status: DC | PRN
  Administered 2019-02-03: 30 mL

## 2019-02-03 MED ORDER — CHLORHEXIDINE GLUCONATE CLOTH 2 % EX PADS: 6.0000 | MEDICATED_PAD | Freq: Once | CUTANEOUS | Status: DC

## 2019-02-03 MED ORDER — CEFAZOLIN SODIUM-DEXTROSE 2-4 GM/100ML-% IV SOLN
INTRAVENOUS | Status: AC
  Filled 2019-02-03: qty 100

## 2019-02-03 MED ORDER — GLYCOPYRROLATE 0.2 MG/ML IJ SOLN
INTRAMUSCULAR | Status: AC
  Filled 2019-02-03: qty 1

## 2019-02-03 MED ORDER — ROCURONIUM BROMIDE 100 MG/10ML IV SOLN
INTRAVENOUS | Status: DC | PRN
  Administered 2019-02-03: 35 mg via INTRAVENOUS
  Administered 2019-02-03: 10 mg via INTRAVENOUS
  Administered 2019-02-03: 5 mg via INTRAVENOUS

## 2019-02-03 MED ORDER — FAMOTIDINE 20 MG PO TABS
ORAL_TABLET | ORAL | Status: AC
  Filled 2019-02-03: qty 1

## 2019-02-03 MED ORDER — FENTANYL CITRATE (PF) 100 MCG/2ML IJ SOLN: 25.0000 ug | INTRAMUSCULAR | Status: DC | PRN

## 2019-02-03 MED ORDER — BUPIVACAINE-EPINEPHRINE (PF) 0.25% -1:200000 IJ SOLN
INTRAMUSCULAR | Status: AC
  Filled 2019-02-03: qty 30

## 2019-02-03 MED ORDER — ONDANSETRON HCL 4 MG/2ML IJ SOLN: 4.0000 mg | Freq: Once | INTRAMUSCULAR | Status: DC | PRN

## 2019-02-03 MED ORDER — DEXAMETHASONE SODIUM PHOSPHATE 10 MG/ML IJ SOLN
INTRAMUSCULAR | Status: DC | PRN
  Administered 2019-02-03: 5 mg via INTRAVENOUS

## 2019-02-03 MED ORDER — ACETAMINOPHEN 10 MG/ML IV SOLN
INTRAVENOUS | Status: DC | PRN
  Administered 2019-02-03: 1000 mg via INTRAVENOUS

## 2019-02-03 MED ORDER — SUGAMMADEX SODIUM 200 MG/2ML IV SOLN
INTRAVENOUS | Status: DC | PRN
  Administered 2019-02-03: 200 mg via INTRAVENOUS

## 2019-02-03 MED ORDER — ONDANSETRON HCL 4 MG/2ML IJ SOLN
INTRAMUSCULAR | Status: DC | PRN
  Administered 2019-02-03: 4 mg via INTRAVENOUS

## 2019-02-03 MED ORDER — OXYCODONE HCL 5 MG PO TABS: 5.0000 mg | ORAL_TABLET | ORAL | 0 refills | Status: AC | PRN

## 2019-02-03 MED ORDER — KETOROLAC TROMETHAMINE 30 MG/ML IJ SOLN
INTRAMUSCULAR | Status: DC | PRN
  Administered 2019-02-03: 30 mg via INTRAVENOUS

## 2019-02-03 MED ORDER — DEXMEDETOMIDINE HCL IN NACL 200 MCG/50ML IV SOLN
INTRAVENOUS | Status: DC | PRN
  Administered 2019-02-03: 20 ug via INTRAVENOUS
  Administered 2019-02-03: 16 ug via INTRAVENOUS

## 2019-02-03 MED ORDER — ACETAMINOPHEN 500 MG PO TABS: 1000.0000 mg | ORAL_TABLET | ORAL | Status: DC

## 2019-02-03 MED ORDER — FENTANYL CITRATE (PF) 250 MCG/5ML IJ SOLN
INTRAMUSCULAR | Status: AC
  Filled 2019-02-03: qty 5

## 2019-02-03 MED ORDER — LACTATED RINGERS IV SOLN
INTRAVENOUS | Status: DC
  Administered 2019-02-03: 13:00:00 via INTRAVENOUS

## 2019-02-03 MED ORDER — FAMOTIDINE 20 MG PO TABS: 20.0000 mg | ORAL_TABLET | Freq: Once | ORAL | Status: DC

## 2019-02-03 MED ORDER — GABAPENTIN 300 MG PO CAPS
ORAL_CAPSULE | ORAL | Status: AC
  Filled 2019-02-03: qty 1

## 2019-02-03 MED ORDER — ONDANSETRON HCL 4 MG/2ML IJ SOLN
INTRAMUSCULAR | Status: AC
  Filled 2019-02-03: qty 2

## 2019-02-03 MED ORDER — SUGAMMADEX SODIUM 200 MG/2ML IV SOLN
INTRAVENOUS | Status: AC
  Filled 2019-02-03: qty 2

## 2019-02-03 MED ORDER — LIDOCAINE HCL (PF) 2 % IJ SOLN
INTRAMUSCULAR | Status: AC
  Filled 2019-02-03: qty 10

## 2019-02-03 MED ORDER — ROCURONIUM BROMIDE 50 MG/5ML IV SOLN
INTRAVENOUS | Status: AC
  Filled 2019-02-03: qty 1

## 2019-02-03 MED ORDER — GABAPENTIN 300 MG PO CAPS: 300.0000 mg | ORAL_CAPSULE | ORAL | Status: DC

## 2019-02-03 MED ORDER — LIDOCAINE HCL (CARDIAC) PF 100 MG/5ML IV SOSY
PREFILLED_SYRINGE | INTRAVENOUS | Status: DC | PRN
  Administered 2019-02-03: 50 mg via INTRAVENOUS

## 2019-02-03 MED ORDER — KETOROLAC TROMETHAMINE 30 MG/ML IJ SOLN
INTRAMUSCULAR | Status: AC
  Filled 2019-02-03: qty 1

## 2019-02-03 MED ORDER — GLYCOPYRROLATE 0.2 MG/ML IJ SOLN
INTRAMUSCULAR | Status: DC | PRN
  Administered 2019-02-03: 0.2 mg via INTRAVENOUS

## 2019-02-03 MED ORDER — 0.9 % SODIUM CHLORIDE (POUR BTL) OPTIME
TOPICAL | Status: DC | PRN
  Administered 2019-02-03: 500 mL

## 2019-02-03 MED ORDER — PROPOFOL 10 MG/ML IV BOLUS
INTRAVENOUS | Status: AC
  Filled 2019-02-03: qty 20

## 2019-02-03 MED ORDER — IBUPROFEN 600 MG PO TABS: 600.0000 mg | ORAL_TABLET | Freq: Three times a day (TID) | ORAL | 0 refills | Status: DC | PRN

## 2019-02-03 MED ORDER — ACETAMINOPHEN 500 MG PO TABS
ORAL_TABLET | ORAL | Status: AC
  Filled 2019-02-03: qty 2

## 2019-02-03 MED ORDER — SUCCINYLCHOLINE CHLORIDE 20 MG/ML IJ SOLN
INTRAMUSCULAR | Status: DC | PRN
  Administered 2019-02-03: 100 mg via INTRAVENOUS

## 2019-02-03 MED ORDER — MIDAZOLAM HCL 2 MG/2ML IJ SOLN
INTRAMUSCULAR | Status: DC | PRN
  Administered 2019-02-03: 2 mg via INTRAVENOUS

## 2019-02-03 MED ORDER — FENTANYL CITRATE (PF) 100 MCG/2ML IJ SOLN
INTRAMUSCULAR | Status: DC | PRN
  Administered 2019-02-03 (×2): 50 ug via INTRAVENOUS
  Administered 2019-02-03: 100 ug via INTRAVENOUS
  Administered 2019-02-03: 50 ug via INTRAVENOUS

## 2019-02-03 MED ORDER — SUCCINYLCHOLINE CHLORIDE 20 MG/ML IJ SOLN
INTRAMUSCULAR | Status: AC
  Filled 2019-02-03: qty 1

## 2019-02-03 MED ORDER — ACETAMINOPHEN 10 MG/ML IV SOLN
INTRAVENOUS | Status: AC
  Filled 2019-02-03: qty 100

## 2019-02-03 SURGICAL SUPPLY — 44 items
APPLIER CLIP 5 13 M/L LIGAMAX5 (MISCELLANEOUS) ×3
BLADE SURG 15 STRL LF DISP TIS (BLADE) ×1 IMPLANT
BLADE SURG 15 STRL SS (BLADE) ×2
CANISTER SUCT 1200ML W/VALVE (MISCELLANEOUS) ×3 IMPLANT
CATH CHOLANGI 4FR 420404F (CATHETERS) IMPLANT
CHLORAPREP W/TINT 26 (MISCELLANEOUS) ×3 IMPLANT
CLIP APPLIE 5 13 M/L LIGAMAX5 (MISCELLANEOUS) ×1 IMPLANT
CONRAY 60ML FOR OR (MISCELLANEOUS) ×3 IMPLANT
COVER WAND RF STERILE (DRAPES) ×3 IMPLANT
DERMABOND ADVANCED (GAUZE/BANDAGES/DRESSINGS) ×2
DERMABOND ADVANCED .7 DNX12 (GAUZE/BANDAGES/DRESSINGS) ×1 IMPLANT
DRAPE C-ARM XRAY 36X54 (DRAPES) IMPLANT
ELECT REM PT RETURN 9FT ADLT (ELECTROSURGICAL) ×3
ELECTRODE REM PT RTRN 9FT ADLT (ELECTROSURGICAL) ×1 IMPLANT
GLOVE SURG SYN 7.0 (GLOVE) ×3 IMPLANT
GLOVE SURG SYN 7.0 PF PI (GLOVE) ×1 IMPLANT
GLOVE SURG SYN 7.5  E (GLOVE) ×2
GLOVE SURG SYN 7.5 E (GLOVE) ×1 IMPLANT
GLOVE SURG SYN 7.5 PF PI (GLOVE) ×1 IMPLANT
GOWN STRL REUS W/ TWL LRG LVL3 (GOWN DISPOSABLE) ×3 IMPLANT
GOWN STRL REUS W/TWL LRG LVL3 (GOWN DISPOSABLE) ×6
IRRIGATION STRYKERFLOW (MISCELLANEOUS) IMPLANT
IRRIGATOR STRYKERFLOW (MISCELLANEOUS)
IV CATH ANGIO 12GX3 LT BLUE (NEEDLE) ×3 IMPLANT
IV NS 1000ML (IV SOLUTION)
IV NS 1000ML BAXH (IV SOLUTION) IMPLANT
JACKSON PRATT 10 (INSTRUMENTS) IMPLANT
L-HOOK LAP DISP 36CM (ELECTROSURGICAL) ×3
LABEL OR SOLS (LABEL) ×3 IMPLANT
LHOOK LAP DISP 36CM (ELECTROSURGICAL) ×1 IMPLANT
NEEDLE HYPO 22GX1.5 SAFETY (NEEDLE) ×6 IMPLANT
PACK LAP CHOLECYSTECTOMY (MISCELLANEOUS) ×3 IMPLANT
PENCIL ELECTRO HAND CTR (MISCELLANEOUS) ×3 IMPLANT
POUCH SPECIMEN RETRIEVAL 10MM (ENDOMECHANICALS) ×3 IMPLANT
SCISSORS METZENBAUM CVD 33 (INSTRUMENTS) ×3 IMPLANT
SET TUBE SMOKE EVAC HIGH FLOW (TUBING) ×3 IMPLANT
SLEEVE ADV FIXATION 5X100MM (TROCAR) ×9 IMPLANT
SPONGE VERSALON 4X4 4PLY (MISCELLANEOUS) IMPLANT
SUT MNCRL 4-0 (SUTURE) ×2
SUT MNCRL 4-0 27XMFL (SUTURE) ×1
SUT VICRYL 0 AB UR-6 (SUTURE) ×3 IMPLANT
SUTURE MNCRL 4-0 27XMF (SUTURE) ×1 IMPLANT
TROCAR BALLN GELPORT 12X130M (ENDOMECHANICALS) ×3 IMPLANT
TROCAR Z-THREAD OPTICAL 5X100M (TROCAR) ×3 IMPLANT

## 2019-02-03 NOTE — Anesthesia Procedure Notes (Signed)
Procedure Name: Intubation °Performed by: Aneeka Bowden, CRNA °Pre-anesthesia Checklist: Patient identified, Patient being monitored, Timeout performed, Emergency Drugs available and Suction available °Patient Re-evaluated:Patient Re-evaluated prior to induction °Oxygen Delivery Method: Circle system utilized °Preoxygenation: Pre-oxygenation with 100% oxygen °Induction Type: IV induction °Ventilation: Mask ventilation without difficulty °Laryngoscope Size: McGraph and 4 °Grade View: Grade I °Tube type: Oral °Tube size: 7.5 mm °Number of attempts: 1 °Airway Equipment and Method: Stylet and Video-laryngoscopy °Placement Confirmation: ETT inserted through vocal cords under direct vision,  positive ETCO2 and breath sounds checked- equal and bilateral °Secured at: 23 cm °Tube secured with: Tape °Dental Injury: Teeth and Oropharynx as per pre-operative assessment  ° ° ° ° ° ° °

## 2019-02-03 NOTE — Anesthesia Preprocedure Evaluation (Signed)
Anesthesia Evaluation  Patient identified by MRN, date of birth, ID band Patient awake    Reviewed: Allergy & Precautions, NPO status , Patient's Chart, lab work & pertinent test results, reviewed documented beta blocker date and time   Airway Mallampati: III  TM Distance: >3 FB     Dental  (+) Chipped   Pulmonary former smoker,           Cardiovascular hypertension, Pt. on medications      Neuro/Psych PSYCHIATRIC DISORDERS Depression    GI/Hepatic GERD  Controlled,  Endo/Other    Renal/GU      Musculoskeletal  (+) Arthritis ,   Abdominal   Peds  Hematology   Anesthesia Other Findings Gout.  Reproductive/Obstetrics                             Anesthesia Physical Anesthesia Plan  ASA: III  Anesthesia Plan: General   Post-op Pain Management:    Induction: Intravenous  PONV Risk Score and Plan:   Airway Management Planned: Oral ETT  Additional Equipment:   Intra-op Plan:   Post-operative Plan:   Informed Consent: I have reviewed the patients History and Physical, chart, labs and discussed the procedure including the risks, benefits and alternatives for the proposed anesthesia with the patient or authorized representative who has indicated his/her understanding and acceptance.       Plan Discussed with: CRNA  Anesthesia Plan Comments:         Anesthesia Quick Evaluation

## 2019-02-03 NOTE — Transfer of Care (Signed)
Immediate Anesthesia Transfer of Care Note  Patient: Shawn Wilson  Procedure(s) Performed: LAPAROSCOPIC CHOLECYSTECTOMY (N/A Abdomen)  Patient Location: PACU  Anesthesia Type:General  Level of Consciousness: sedated  Airway & Oxygen Therapy: Patient Spontanous Breathing and Patient connected to face mask oxygen  Post-op Assessment: Report given to RN and Post -op Vital signs reviewed and stable  Post vital signs: Reviewed  Last Vitals:  Vitals Value Taken Time  BP 114/76 02/03/19 1435  Temp    Pulse 73 02/03/19 1435  Resp 23 02/03/19 1435  SpO2 96 % 02/03/19 1435    Last Pain:  Vitals:   02/03/19 1118  TempSrc: Oral  PainSc: 3          Complications: No apparent anesthesia complications

## 2019-02-03 NOTE — Discharge Instructions (Signed)

## 2019-02-03 NOTE — Anesthesia Post-op Follow-up Note (Signed)
Anesthesia QCDR form completed.        

## 2019-02-03 NOTE — Op Note (Signed)
  Procedure Date:  02/03/2019  Pre-operative Diagnosis:  Acute cholecystitis  Post-operative Diagnosis:  Acute cholecystitis  Procedure:  Laparoscopic cholecystectomy  Surgeon:  Melvyn Neth, MD  Anesthesia:  General endotracheal  Estimated Blood Loss:  10 ml  Specimens:  gallbladder  Complications:  None  Indications for Procedure:  This is a 43 y.o. male seen in the office yesterday with abdominal pain and workup revealing acute cholecystitis.  The benefits, complications, treatment options, and expected outcomes were discussed with the patient. The risks of bleeding, infection, recurrence of symptoms, failure to resolve symptoms, bile duct damage, bile duct leak, retained common bile duct stone, bowel injury, and need for further procedures were all discussed with the patient and he was willing to proceed.  Description of Procedure: The patient was correctly identified in the preoperative area and brought into the operating room.  The patient was placed supine with VTE prophylaxis in place.  Appropriate time-outs were performed.  Anesthesia was induced and the patient was intubated.  Appropriate antibiotics were infused.  The abdomen was prepped and draped in a sterile fashion. An infraumbilical incision was made. A cutdown technique was used to enter the abdominal cavity without injury, and a Hasson trocar was inserted.  Pneumoperitoneum was obtained with appropriate opening pressures.  A 5-mm port was placed in the subxiphoid area and two 5-mm ports were placed in the right upper quadrant under direct visualization.  The gallbladder was identified.  The fundus was grasped and retracted cephalad.  Adhesions were lysed bluntly and with electrocautery. The infundibulum was grasped and retracted laterally, exposing the peritoneum overlying the gallbladder.  This was incised with electrocautery and extended on either side of the gallbladder.  The cystic duct and cystic artery were clearly  identified and bluntly dissected.  Both were clipped twice proximally and once distally, cutting in between.  The gallbladder was taken from the gallbladder fossa in a retrograde fashion with electrocautery. The gallbladder was placed in an Endocatch bag. The liver bed was inspected and any bleeding was controlled with electrocautery. The right upper quadrant was then inspected again revealing intact clips, no bleeding, and no ductal injury.    The 5 mm ports were removed under direct visualization and the Hasson trocar was removed.  The Endocatch bag was brought out via the umbilical incision. The fascial opening was closed using 0 vicryl suture.  Local anesthetic was infused in all incisions and the incisions were closed with 4-0 Monocryl.  The wounds were cleaned and sealed with DermaBond.  The patient was emerged from anesthesia and extubated and brought to the recovery room for further management.  The patient tolerated the procedure well and all counts were correct at the end of the case.   Melvyn Neth, MD

## 2019-02-03 NOTE — Anesthesia Postprocedure Evaluation (Signed)
Anesthesia Post Note  Patient: Shawn Wilson  Procedure(s) Performed: LAPAROSCOPIC CHOLECYSTECTOMY (N/A Abdomen)  Patient location during evaluation: PACU Anesthesia Type: General Level of consciousness: awake and alert Pain management: pain level controlled Vital Signs Assessment: post-procedure vital signs reviewed and stable Respiratory status: spontaneous breathing, nonlabored ventilation, respiratory function stable and patient connected to nasal cannula oxygen Cardiovascular status: blood pressure returned to baseline and stable Postop Assessment: no apparent nausea or vomiting Anesthetic complications: no     Last Vitals:  Vitals:   02/03/19 1515 02/03/19 1525  BP: (!) 129/91 (!) 140/91  Pulse: 85 78  Resp: 16 16  Temp: (!) 36.4 C (!) 36.1 C  SpO2: 94% 95%    Last Pain:  Vitals:   02/03/19 1525  TempSrc: Temporal  PainSc: [place]

## 2019-02-03 NOTE — Interval H&P Note (Signed)
History and Physical Interval Note:  02/03/2019 12:40 PM  Shawn Wilson  has presented today for surgery, with the diagnosis of Acute Cholecystitis.  The various methods of treatment have been discussed with the patient and family. After consideration of risks, benefits and other options for treatment, the patient has consented to  Procedure(s): LAPAROSCOPIC CHOLECYSTECTOMY (N/A) as a surgical intervention.  The patient's history has been reviewed, patient examined, no change in status, stable for surgery.  I have reviewed the patient's chart and labs.  Questions were answered to the patient's satisfaction.     Eddi Hymes

## 2019-02-04 ENCOUNTER — Encounter: Payer: Self-pay | Admitting: Surgery

## 2019-02-08 LAB — SURGICAL PATHOLOGY

## 2019-02-11 ENCOUNTER — Telehealth: Payer: Self-pay

## 2019-02-11 NOTE — Telephone Encounter (Signed)
[employer] disability paperwork completed and faxed. Return to work date 02/19/2019 per patient- will need to update on paperwork. Placed in scan folder.

## 2019-02-18 ENCOUNTER — Encounter: Payer: Self-pay | Admitting: Physician Assistant

## 2019-02-18 ENCOUNTER — Other Ambulatory Visit: Payer: Self-pay

## 2019-02-18 ENCOUNTER — Ambulatory Visit (INDEPENDENT_AMBULATORY_CARE_PROVIDER_SITE_OTHER): Payer: BC Managed Care – PPO | Admitting: Physician Assistant

## 2019-02-18 VITALS — BP 137/90 | HR 78 | Temp 98.7°F | Ht 72.0 in | Wt 213.0 lb

## 2019-02-18 DIAGNOSIS — K8000 Calculus of gallbladder with acute cholecystitis without obstruction: Secondary | ICD-10-CM

## 2019-02-18 DIAGNOSIS — Z09 Encounter for follow-up examination after completed treatment for conditions other than malignant neoplasm: Secondary | ICD-10-CM

## 2019-02-18 NOTE — Progress Notes (Signed)
[facility] POST-OP OFFICE VISIT  02/18/2019  HPI: Shawn Wilson is a 43 y.o. male 15 days s/p laparoscopic cholecystectomy for acute cholecystitis. He reports his incisional soreness has resolved. NO nausea or emesis. He does notice intermittent diarrhea but is uncertain of any association with certain foods. Otherwise doing well and ready to return to work. No other complaints  Vital signs: BP 137/90   Pulse 78   Temp 98.7 F (37.1 C) (Skin)   Ht 6' (1.829 m)   Wt 213 lb (96.6 kg)   SpO2 98%   BMI 28.89 kg/m    Physical Exam: Constitutional: Well appearing male, NAD Abdomen:Soft, non-tender, non-distended. Laparoscopic incisions are well healing, no erythema or drainage  Assessment/Plan: This is a 43 y.o. male 15 days s/p laparoscopic cholecystectomy for acute cholecystitis.   - Okay to return to work 07/20  - Complete 2 more weeks of no heavy lifting  - Okay to submerge wounds  - Reviewed pathology  - RTC PRN  -- Edison Simon, PA-C [facility] 02/18/2019, 9:12 AM [phone] M-F: 7am - 4pm

## 2019-02-18 NOTE — Patient Instructions (Signed)
Return as needed.The patient is aware to call back for any questions or concerns. May return to work on 02/21/2019.

## 2019-10-21 ENCOUNTER — Other Ambulatory Visit: Payer: Self-pay | Admitting: Family Medicine

## 2019-10-21 ENCOUNTER — Ambulatory Visit
Admission: RE | Admit: 2019-10-21 | Discharge: 2019-10-21 | Disposition: A | Discharge status: Home / Self Care | Payer: BC Managed Care – PPO | Attending: Family Medicine | Admitting: Family Medicine

## 2019-10-21 ENCOUNTER — Ambulatory Visit
Admission: RE | Admit: 2019-10-21 | Discharge: 2019-10-21 | Disposition: A | Discharge status: Home / Self Care | Payer: BC Managed Care – PPO | Source: Ambulatory Visit | Attending: Family Medicine | Admitting: Family Medicine

## 2019-10-21 ENCOUNTER — Other Ambulatory Visit: Payer: Self-pay

## 2019-10-21 DIAGNOSIS — R05 Cough: Secondary | ICD-10-CM

## 2019-10-21 DIAGNOSIS — R059 Cough, unspecified: Secondary | ICD-10-CM

## 2019-11-16 ENCOUNTER — Other Ambulatory Visit: Payer: Self-pay

## 2019-11-16 ENCOUNTER — Ambulatory Visit (INDEPENDENT_AMBULATORY_CARE_PROVIDER_SITE_OTHER): Payer: Self-pay | Admitting: Gastroenterology

## 2019-11-16 DIAGNOSIS — Z1211 Encounter for screening for malignant neoplasm of colon: Secondary | ICD-10-CM

## 2019-11-16 NOTE — Progress Notes (Signed)
Gastroenterology Pre-Procedure Review  Request Date: Wed 12/14/19 Requesting Physician: Dr. Marius Ditch  PATIENT REVIEW QUESTIONS: The patient responded to the following health history questions as indicated:    1. Are you having any GI issues? no 2. Do you have a personal history of Polyps? no 3. Do you have a family history of Colon Cancer or Polyps? no 4. Diabetes Mellitus? no 5. Joint replacements in the past 12 months?no 6. Major health problems in the past 3 months?no 7. Any artificial heart valves, MVP, or defibrillator?no    MEDICATIONS & ALLERGIES:    Patient reports the following regarding taking any anticoagulation/antiplatelet therapy:   Plavix, Coumadin, Eliquis, Xarelto, Lovenox, Pradaxa, Brilinta, or Effient? no Aspirin? no  Patient confirms/reports the following medications:  Current Outpatient Medications  Medication Sig Dispense Refill  . esomeprazole (NEXIUM) 20 MG capsule Take 40 mg by mouth 2 (two) times a day.    . irbesartan (AVAPRO) 75 MG tablet Take 75 mg by mouth daily.    Marland Kitchen triamterene-hydrochlorothiazide (MAXZIDE) 75-50 MG tablet Take by mouth.    Marland Kitchen albuterol (VENTOLIN HFA) 108 (90 Base) MCG/ACT inhaler INHALE 2 PUFFS BY MOUTH EVERY 4 HOURS AS NEEDED    . ibuprofen (ADVIL) 600 MG tablet Take 1 tablet (600 mg total) by mouth every 8 (eight) hours as needed for fever, headache, mild pain or moderate pain. (Patient not taking: Reported on 11/16/2019) 30 tablet 0  . oxyCODONE (OXY IR/ROXICODONE) 5 MG immediate release tablet Take 1 tablet (5 mg total) by mouth every 4 (four) hours as needed for severe pain. (Patient not taking: Reported on 11/16/2019) 30 tablet 0   No current facility-administered medications for this visit.    Patient confirms/reports the following allergies:  No Known Allergies  No orders of the defined types were placed in this encounter.   AUTHORIZATION INFORMATION Primary Insurance: 1D#: Group #:  Secondary Insurance: 1D#: Group  #:  SCHEDULE INFORMATION: Date: 12/14/19 Time: Location:[facility]

## 2019-12-07 ENCOUNTER — Telehealth: Payer: Self-pay

## 2019-12-07 NOTE — Telephone Encounter (Signed)
Returned patient's call to answer patients questions regarding colonoscopy.  LVM for him to call me back.  Thanks,  [place], [place]

## 2019-12-07 NOTE — Telephone Encounter (Signed)
Patient left a voicemail requesting a call back from [place]. Patient states he has a colonoscopy scheduled on 12/14/2019 and has some questions about it

## 2019-12-12 ENCOUNTER — Other Ambulatory Visit
Admission: RE | Admit: 2019-12-12 | Discharge: 2019-12-12 | Disposition: A | Discharge status: Home / Self Care | Payer: BC Managed Care – PPO | Source: Ambulatory Visit | Attending: Gastroenterology | Admitting: Gastroenterology

## 2019-12-12 DIAGNOSIS — Z01812 Encounter for preprocedural laboratory examination: Secondary | ICD-10-CM | POA: Insufficient documentation

## 2019-12-12 DIAGNOSIS — Z20822 Contact with and (suspected) exposure to covid-19: Secondary | ICD-10-CM | POA: Insufficient documentation

## 2019-12-12 LAB — SARS CORONAVIRUS 2 (TAT 6-24 HRS): SARS Coronavirus 2: NEGATIVE

## 2019-12-14 ENCOUNTER — Ambulatory Visit: Payer: BC Managed Care – PPO | Admitting: Certified Registered Nurse Anesthetist

## 2019-12-14 ENCOUNTER — Ambulatory Visit
Admission: RE | Admit: 2019-12-14 | Discharge: 2019-12-14 | Disposition: A | Discharge status: Home / Self Care | Payer: BC Managed Care – PPO | Attending: Gastroenterology | Admitting: Gastroenterology

## 2019-12-14 ENCOUNTER — Encounter
Admission: RE | Disposition: A | Discharge status: Home / Self Care | Payer: Self-pay | Source: Home / Self Care | Attending: Gastroenterology

## 2019-12-14 ENCOUNTER — Encounter: Payer: Self-pay | Admitting: Gastroenterology

## 2019-12-14 DIAGNOSIS — K219 Gastro-esophageal reflux disease without esophagitis: Secondary | ICD-10-CM | POA: Insufficient documentation

## 2019-12-14 DIAGNOSIS — D123 Benign neoplasm of transverse colon: Secondary | ICD-10-CM | POA: Diagnosis not present

## 2019-12-14 DIAGNOSIS — Z87891 Personal history of nicotine dependence: Secondary | ICD-10-CM | POA: Diagnosis not present

## 2019-12-14 DIAGNOSIS — I10 Essential (primary) hypertension: Secondary | ICD-10-CM | POA: Diagnosis not present

## 2019-12-14 DIAGNOSIS — Z1211 Encounter for screening for malignant neoplasm of colon: Secondary | ICD-10-CM | POA: Diagnosis not present

## 2019-12-14 DIAGNOSIS — K635 Polyp of colon: Secondary | ICD-10-CM | POA: Diagnosis not present

## 2019-12-14 DIAGNOSIS — Z79899 Other long term (current) drug therapy: Secondary | ICD-10-CM | POA: Diagnosis not present

## 2019-12-14 HISTORY — PX: COLONOSCOPY WITH PROPOFOL: SHX5780

## 2019-12-14 SURGERY — COLONOSCOPY WITH PROPOFOL
Anesthesia: General

## 2019-12-14 MED ORDER — PROPOFOL 500 MG/50ML IV EMUL
INTRAVENOUS | Status: DC | PRN
  Administered 2019-12-14: 150 ug/kg/min via INTRAVENOUS

## 2019-12-14 MED ORDER — PROPOFOL 10 MG/ML IV BOLUS
INTRAVENOUS | Status: DC | PRN
  Administered 2019-12-14: 20 mg via INTRAVENOUS
  Administered 2019-12-14: 70 mg via INTRAVENOUS

## 2019-12-14 MED ORDER — COLESTIPOL HCL 5 G PO PACK: 5.0000 g | PACK | Freq: Two times a day (BID) | ORAL | 0 refills | Status: AC | PRN

## 2019-12-14 MED ORDER — PROPOFOL 500 MG/50ML IV EMUL
INTRAVENOUS | Status: AC
  Filled 2019-12-14: qty 50

## 2019-12-14 MED ORDER — LIDOCAINE HCL (PF) 2 % IJ SOLN
INTRAMUSCULAR | Status: AC
  Filled 2019-12-14: qty 5

## 2019-12-14 MED ORDER — SODIUM CHLORIDE 0.9 % IV SOLN
INTRAVENOUS | Status: DC
  Administered 2019-12-14: 1000 mL via INTRAVENOUS

## 2019-12-14 MED ORDER — PHENYLEPHRINE HCL (PRESSORS) 10 MG/ML IV SOLN
INTRAVENOUS | Status: AC
  Filled 2019-12-14: qty 1

## 2019-12-14 MED ORDER — GLYCOPYRROLATE 0.2 MG/ML IJ SOLN
INTRAMUSCULAR | Status: AC
  Filled 2019-12-14: qty 1

## 2019-12-14 NOTE — Op Note (Addendum)
[facility] Patient Name: Shawn Wilson Procedure Date: 12/14/2019 7:29 AM MRN: [id number] Account #: [id number] Date of Birth: 12-18-1975 Admit Type: Outpatient Age: 44 Room: [facility] ENDO ROOM 4 Gender: Male Note Status: Finalized Procedure:             Colonoscopy Indications:           Screening for colorectal malignant neoplasm, This is                         the patient's first colonoscopy Providers:             Lin Landsman MD, MD Referring MD:          Lynnell Jude (Referring MD) Medicines:             Monitored Anesthesia Care Complications:         No immediate complications. Estimated blood loss: None. Procedure:             Pre-Anesthesia Assessment:                        - Prior to the procedure, a History and Physical was                         performed, and patient medications and allergies were                         reviewed. The patient is competent. The risks and                         benefits of the procedure and the sedation options and                         risks were discussed with the patient. All questions                         were answered and informed consent was obtained.                         Patient identification and proposed procedure were                         verified by the physician, the nurse, the                         anesthesiologist, the anesthetist and the technician                         in the pre-procedure area in the procedure room in the                         endoscopy suite. Mental Status Examination: alert and                         oriented. Airway Examination: normal oropharyngeal                         airway and neck mobility. Respiratory Examination:  clear to auscultation. CV Examination: normal.                         Prophylactic Antibiotics: The patient does not require                         prophylactic antibiotics. Prior Anticoagulants:  The                         patient has taken no previous anticoagulant or                         antiplatelet agents. ASA Grade Assessment: II - A                         patient with mild systemic disease. After reviewing                         the risks and benefits, the patient was deemed in                         satisfactory condition to undergo the procedure. The                         anesthesia plan was to use monitored anesthesia care                         (MAC). Immediately prior to administration of                         medications, the patient was re-assessed for adequacy                         to receive sedatives. The heart rate, respiratory                         rate, oxygen saturations, blood pressure, adequacy of                         pulmonary ventilation, and response to care were                         monitored throughout the procedure. The physical                         status of the patient was re-assessed after the                         procedure.                        After obtaining informed consent, the colonoscope was                         passed under direct vision. Throughout the procedure,                         the patient's blood pressure, pulse, and oxygen  saturations were monitored continuously. The                         Colonoscope was introduced through the anus and                         advanced to the the terminal ileum, with                         identification of the appendiceal orifice and IC                         valve. The colonoscopy was performed without                         difficulty. The patient tolerated the procedure well.                         The quality of the bowel preparation was evaluated                         using the BBPS [facility] Bowel Preparation Scale) with                         scores of: Right Colon = 2 (minor amount of residual                         staining,  small fragments of stool and/or opaque                         liquid, but mucosa seen well), Transverse Colon = 2                         (minor amount of residual staining, small fragments of                         stool and/or opaque liquid, but mucosa seen well) and                         Left Colon = 2 (minor amount of residual staining,                         small fragments of stool and/or opaque liquid, but                         mucosa seen well). The total BBPS score equals 6. The                         quality of the bowel preparation was fair. Findings:      The perianal and digital rectal examinations were normal. Pertinent       negatives include normal sphincter tone and no palpable rectal lesions.      The terminal ileum appeared normal.      A 15 mm polyp was found in the hepatic flexure. The polyp was       carpet-like, flat and sessile. Preparations were made for mucosal       resection. Eleview was  injected to raise the lesion. Snare mucosal       resection was performed. Resection was complete, and retrieval was       complete. To prevent bleeding after mucosal resection, two hemostatic       clips were successfully placed. There was no bleeding at the end of the       procedure.      Extensive amounts of semi-liquid stool was found in the entire colon,       precluding visualization. Lavage of the area was performed using 50 -       200 mL of sterile water, resulting in clearance with fair visualization.      The retroflexed view of the distal rectum and anal verge was normal and       showed no anal or rectal abnormalities. Impression:            - Preparation of the colon was fair.                        - The examined portion of the ileum was normal.                        - One 15 mm polyp at the hepatic flexure, removed with                         mucosal resection. Resected and retrieved. Clips were                         placed.                         - Stool in the entire examined colon.                        - The distal rectum and anal verge are normal on                         retroflexion view.                        - Mucosal resection was performed. Resection was                         complete, and retrieval was complete. Recommendation:        - Discharge patient to home (with escort).                        - Resume previous diet today.                        - Continue present medications.                        - Await pathology results.                        - Repeat colonoscopy in 6 months with 2 day prep                         because the bowel preparation was suboptimal. Procedure Code(s):     ---  Professional ---                        [phone], Colonoscopy, flexible; with endoscopic mucosal                         resection Diagnosis Code(s):     --- Professional ---                        Z12.11, Encounter for screening for malignant neoplasm                         of colon                        K63.5, Polyp of colon CPT copyright 2019 American Medical Association. All rights reserved. The codes documented in this report are preliminary and upon coder review may  be revised to meet current compliance requirements. Dr. Ulyess Mort Lin Landsman MD, MD 12/14/2019 9:29:40 AM This report has been signed electronically. Number of Addenda: 0 Note Initiated On: 12/14/2019 7:29 AM Scope Withdrawal Time: 0 hours 23 minutes 13 seconds  Total Procedure Duration: 0 hours 25 minutes 1 second  Estimated Blood Loss:  Estimated blood loss: none.      [facility]

## 2019-12-14 NOTE — Anesthesia Preprocedure Evaluation (Signed)
Anesthesia Evaluation  Patient identified by MRN, date of birth, ID band Patient awake    Reviewed: Allergy & Precautions, H&P , NPO status , Patient's Chart, lab work & pertinent test results  History of Anesthesia Complications Negative for: history of anesthetic complications  Airway Mallampati: III  TM Distance: >3 FB Neck ROM: full    Dental  (+) Chipped, Poor Dentition   Pulmonary neg shortness of breath, Patient abstained from smoking., former smoker,    Pulmonary exam normal        Cardiovascular Exercise Tolerance: Good hypertension, (-) angina(-) Past MI and (-) DOE Normal cardiovascular exam     Neuro/Psych PSYCHIATRIC DISORDERS  Neuromuscular disease    GI/Hepatic Neg liver ROS, GERD  Controlled and Medicated,  Endo/Other  negative endocrine ROS  Renal/GU negative Renal ROS  negative genitourinary   Musculoskeletal  (+) Arthritis ,   Abdominal   Peds  Hematology negative hematology ROS (+)   Anesthesia Other Findings Past Medical History: No date: Hypertension No date: Thyroid disease  Past Surgical History: No date: CERVICAL FUSION 02/03/2019: CHOLECYSTECTOMY; N/A     Comment:  Procedure: LAPAROSCOPIC CHOLECYSTECTOMY;  Surgeon:               Olean Ree, MD;  Location: [facility] ORS;  Service:               General;  Laterality: N/A;  BMI    Body Mass Index: 28.25 kg/m      Reproductive/Obstetrics negative OB ROS                             Anesthesia Physical Anesthesia Plan  ASA: II  Anesthesia Plan: General   Post-op Pain Management:    Induction: Intravenous  PONV Risk Score and Plan: Propofol infusion and TIVA  Airway Management Planned: Natural Airway and Nasal Cannula  Additional Equipment:   Intra-op Plan:   Post-operative Plan:   Informed Consent: I have reviewed the patients History and Physical, chart, labs and discussed the procedure  including the risks, benefits and alternatives for the proposed anesthesia with the patient or authorized representative who has indicated his/her understanding and acceptance.     Dental Advisory Given  Plan Discussed with: Anesthesiologist, CRNA and Surgeon  Anesthesia Plan Comments: (Patient consented for risks of anesthesia including but not limited to:  - adverse reactions to medications - risk of intubation if required - damage to eyes, teeth, lips or other oral mucosa - nerve damage due to positioning  - sore throat or hoarseness - Damage to heart, brain, nerves, lungs or loss of life  Patient voiced understanding.)        Anesthesia Quick Evaluation

## 2019-12-14 NOTE — Anesthesia Procedure Notes (Signed)
Date/Time: 12/14/2019 7:45 AM Performed by: Johnna Acosta, CRNA Pre-anesthesia Checklist: Patient identified, Emergency Drugs available, Suction available, Patient being monitored and Timeout performed Patient Re-evaluated:Patient Re-evaluated prior to induction Oxygen Delivery Method: Nasal cannula Preoxygenation: Pre-oxygenation with 100% oxygen Induction Type: IV induction

## 2019-12-14 NOTE — Anesthesia Postprocedure Evaluation (Signed)
Anesthesia Post Note  Patient: LANDER HEMAUER  Procedure(s) Performed: COLONOSCOPY WITH PROPOFOL (N/A )  Patient location during evaluation: Endoscopy Anesthesia Type: General Level of consciousness: awake and alert Pain management: pain level controlled Vital Signs Assessment: post-procedure vital signs reviewed and stable Respiratory status: spontaneous breathing, nonlabored ventilation, respiratory function stable and patient connected to nasal cannula oxygen Cardiovascular status: blood pressure returned to baseline and stable Postop Assessment: no apparent nausea or vomiting Anesthetic complications: no     Last Vitals:  Vitals:   12/14/19 0929 12/14/19 0931  BP: (!) 98/53 (!) 98/53  Pulse: 72 72  Resp: 19 19  Temp: 36.7 C   SpO2: 97% 97%    Last Pain:  Vitals:   12/14/19 0949  TempSrc:   PainSc: 0-No pain                 Precious Haws Wynton Hufstetler

## 2019-12-14 NOTE — H&P (Signed)
Cephas Darby, MD [address]  [place]  [place], [place] [address]  Main: [phone]  Fax: [phone] Pager: [phone]  Primary Care Physician:  Lynnell Jude, MD Primary Gastroenterologist:  Dr. Cephas Darby  Pre-Procedure History & Physical: HPI:  Shawn Wilson is a 44 y.o. male is here for an colonoscopy.   Past Medical History:  Diagnosis Date  . Hypertension   . Thyroid disease     Past Surgical History:  Procedure Laterality Date  . CERVICAL FUSION    . CHOLECYSTECTOMY N/A 02/03/2019   Procedure: LAPAROSCOPIC CHOLECYSTECTOMY;  Surgeon: Olean Ree, MD;  Location: [facility] ORS;  Service: General;  Laterality: N/A;    Prior to Admission medications   Medication Sig Start Date End Date Taking? Authorizing Provider  albuterol (VENTOLIN HFA) 108 (90 Base) MCG/ACT inhaler INHALE 2 PUFFS BY MOUTH EVERY 4 HOURS AS NEEDED 11/03/19  Yes [provider]  esomeprazole (NEXIUM) 20 MG capsule Take 40 mg by mouth 2 (two) times a day.   Yes [provider]  ibuprofen (ADVIL) 600 MG tablet Take 1 tablet (600 mg total) by mouth every 8 (eight) hours as needed for fever, headache, mild pain or moderate pain. 02/03/19  Yes Piscoya, Jacqulyn Bath, MD  irbesartan (AVAPRO) 75 MG tablet Take 75 mg by mouth daily. 11/08/19  Yes [provider]  oxyCODONE (OXY IR/ROXICODONE) 5 MG immediate release tablet Take 1 tablet (5 mg total) by mouth every 4 (four) hours as needed for severe pain. 02/03/19  Yes Piscoya, Jacqulyn Bath, MD  triamterene-hydrochlorothiazide (MAXZIDE) 75-50 MG tablet Take by mouth.   Yes [provider]    Allergies as of 11/16/2019  . (No Known Allergies)    History reviewed. No pertinent family history.  Social History   Socioeconomic History  . Marital status: Married    Spouse name: Not on file  . Number of children: Not on file  . Years of education: Not on file  . Highest education level: Not on file  Occupational History  . Not on  file  Tobacco Use  . Smoking status: Former Research scientist (life sciences)  . Smokeless tobacco: Never Used  Substance and Sexual Activity  . Alcohol use: Yes  . Drug use: No  . Sexual activity: Not on file  Other Topics Concern  . Not on file  Social History Narrative  . Not on file   Social Determinants of Health   Financial Resource Strain:   . Difficulty of Paying Living Expenses:   Food Insecurity:   . Worried About Charity fundraiser in the Last Year:   . Arboriculturist in the Last Year:   Transportation Needs:   . Film/video editor (Medical):   Marland Kitchen Lack of Transportation (Non-Medical):   Physical Activity:   . Days of Exercise per Week:   . Minutes of Exercise per Session:   Stress:   . Feeling of Stress :   Social Connections:   . Frequency of Communication with Friends and Family:   . Frequency of Social Gatherings with Friends and Family:   . Attends Religious Services:   . Active Member of Clubs or Organizations:   . Attends Archivist Meetings:   Marland Kitchen Marital Status:   Intimate Partner Violence:   . Fear of Current or Ex-Partner:   . Emotionally Abused:   Marland Kitchen Physically Abused:   . Sexually Abused:     Review of Systems: See HPI, otherwise negative ROS  Physical  Exam: BP (!) 157/99   Pulse 62   Temp (!) 97.1 F (36.2 C) (Temporal)   Resp 20   Ht 6\' 2"  (1.88 m)   Wt 99.8 kg   SpO2 100%   BMI 28.25 kg/m  General:   Alert,  pleasant and cooperative in NAD Head:  Normocephalic and atraumatic. Neck:  Supple; no masses or thyromegaly. Lungs:  Clear throughout to auscultation.    Heart:  Regular rate and rhythm. Abdomen:  Soft, nontender and nondistended. Normal bowel sounds, without guarding, and without rebound.   Neurologic:  Alert and  oriented x4;  grossly normal neurologically.  Impression/Plan: EBEN BEZIO is here for an colonoscopy to be performed for colon cancer screening  Risks, benefits, limitations, and alternatives regarding  colonoscopy have  been reviewed with the patient.  Questions have been answered.  All parties agreeable.   Sherri Sear, MD  12/14/2019, 8:53 AM

## 2019-12-14 NOTE — Transfer of Care (Signed)
Immediate Anesthesia Transfer of Care Note  Patient: Shawn Wilson  Procedure(s) Performed: COLONOSCOPY WITH PROPOFOL (N/A )  Patient Location: PACU  Anesthesia Type:General  Level of Consciousness: sedated  Airway & Oxygen Therapy: Patient Spontanous Breathing  Post-op Assessment: Report given to RN and Post -op Vital signs reviewed and stable  Post vital signs: Reviewed and stable  Last Vitals:  Vitals Value Taken Time  BP 98/53 12/14/19 0931  Temp 36.7 C 12/14/19 0929  Pulse 72 12/14/19 0931  Resp 19 12/14/19 0931  SpO2 97 % 12/14/19 0931    Last Pain:  Vitals:   12/14/19 0929  TempSrc: Temporal  PainSc: Asleep         Complications: No apparent anesthesia complications

## 2019-12-15 ENCOUNTER — Encounter: Payer: Self-pay | Admitting: *Deleted

## 2019-12-15 ENCOUNTER — Encounter: Payer: Self-pay | Admitting: Gastroenterology

## 2019-12-15 LAB — SURGICAL PATHOLOGY

## 2020-01-20 ENCOUNTER — Encounter: Payer: Self-pay | Admitting: Family Medicine

## 2020-06-19 ENCOUNTER — Ambulatory Visit
Admission: RE | Admit: 2020-06-19 | Discharge: 2020-06-19 | Disposition: A | Discharge status: Home / Self Care | Payer: BC Managed Care – PPO | Attending: Family Medicine | Admitting: Family Medicine

## 2020-06-19 ENCOUNTER — Ambulatory Visit
Admission: RE | Admit: 2020-06-19 | Discharge: 2020-06-19 | Disposition: A | Discharge status: Home / Self Care | Payer: BC Managed Care – PPO | Source: Ambulatory Visit | Attending: Family Medicine | Admitting: Family Medicine

## 2020-06-19 ENCOUNTER — Other Ambulatory Visit: Payer: Self-pay

## 2020-06-19 ENCOUNTER — Other Ambulatory Visit: Payer: Self-pay | Admitting: Family Medicine

## 2020-06-19 DIAGNOSIS — R079 Chest pain, unspecified: Secondary | ICD-10-CM

## 2020-06-29 IMAGING — CR DG CHEST 2V
2 series · 3 of 3 positions shown · non-contrast
Comparison: 04/21/2018

CLINICAL DATA: Cough.

EXAM:
CHEST - 2 VIEW

[chest pa]
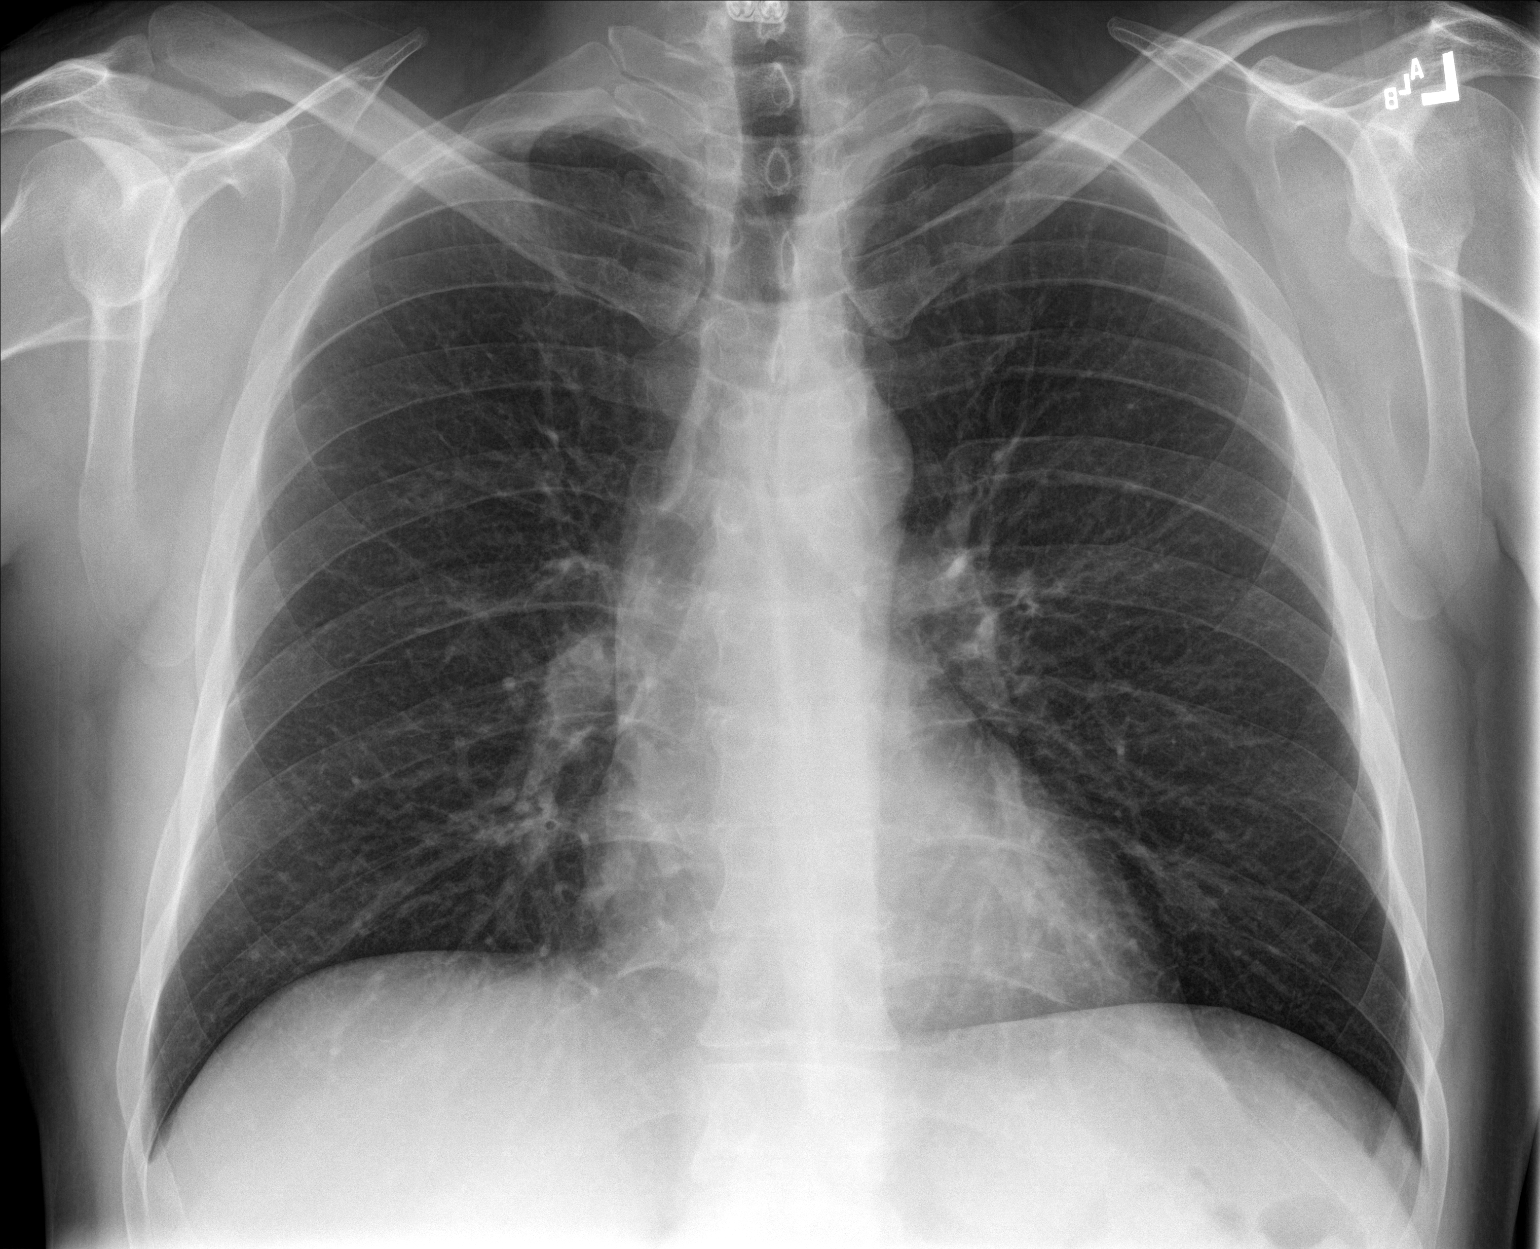

[Series 2: chest lat · 0.14mm/px · 2 of 2 slices shown]
[im 1/2]
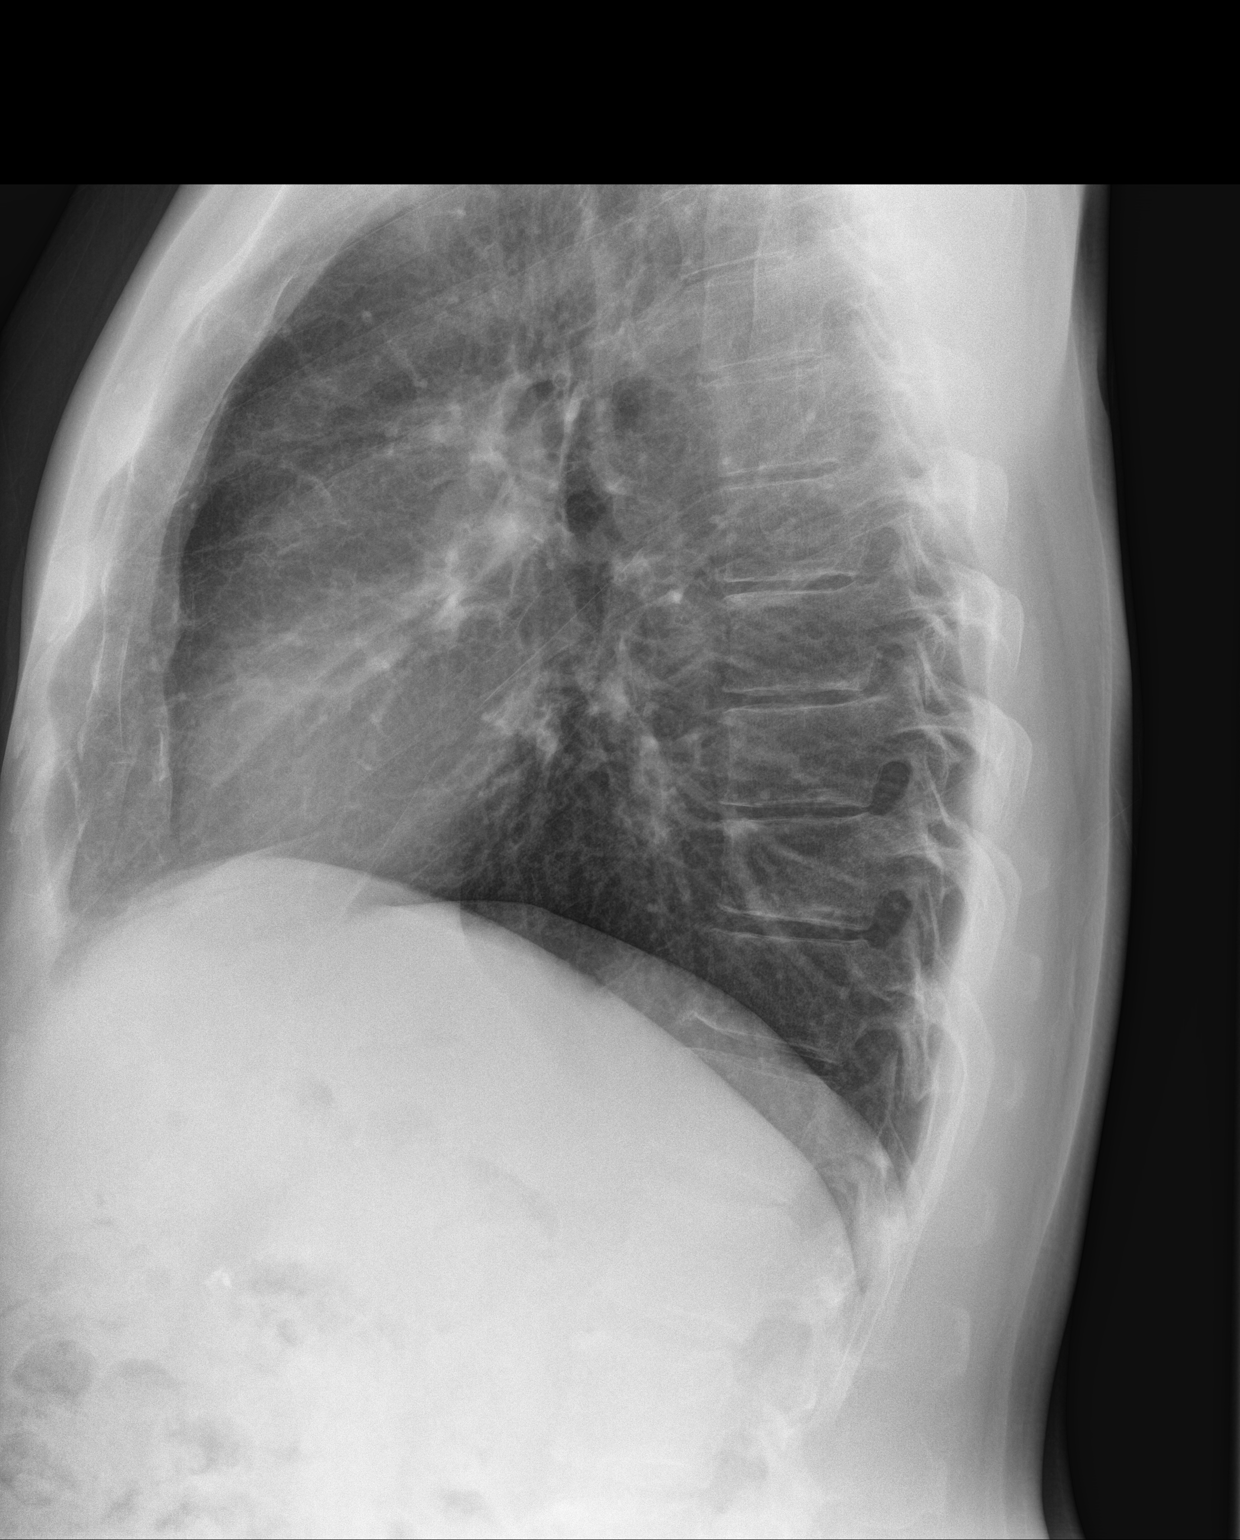
[im 2/2]
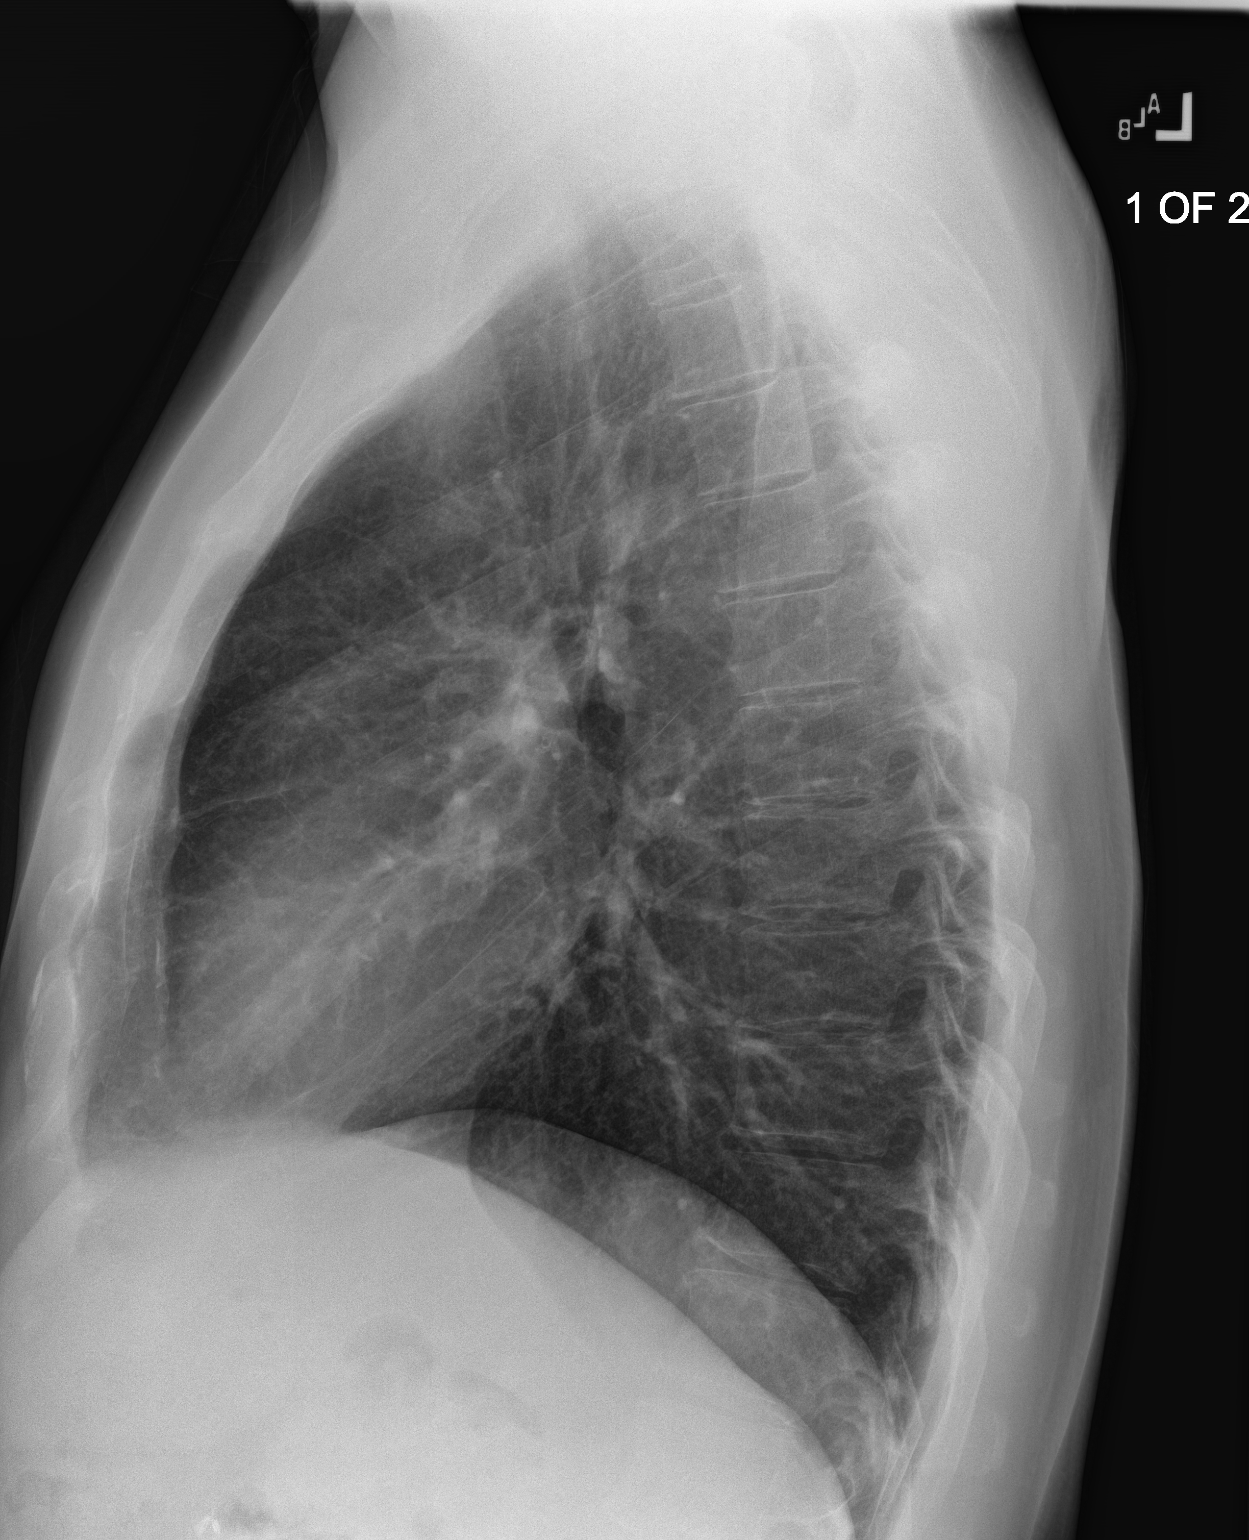

[3 of 3 positions shown; findings below may reference images not displayed]

FINDINGS: The heart size and mediastinal contours are within normal limits.
Both lungs are clear. The visualized skeletal structures are
unremarkable.
IMPRESSION: No active cardiopulmonary disease.

## 2021-05-14 ENCOUNTER — Other Ambulatory Visit: Payer: Self-pay | Admitting: Family Medicine

## 2021-05-14 DIAGNOSIS — G459 Transient cerebral ischemic attack, unspecified: Secondary | ICD-10-CM

## 2021-05-22 ENCOUNTER — Other Ambulatory Visit: Payer: Self-pay

## 2021-05-22 ENCOUNTER — Ambulatory Visit
Admission: RE | Admit: 2021-05-22 | Discharge: 2021-05-22 | Disposition: A | Discharge status: Home / Self Care | Payer: BC Managed Care – PPO | Source: Ambulatory Visit | Attending: Family Medicine | Admitting: Family Medicine

## 2021-05-22 DIAGNOSIS — G459 Transient cerebral ischemic attack, unspecified: Secondary | ICD-10-CM | POA: Insufficient documentation

## 2022-01-29 IMAGING — CT CT HEAD W/O CM
1 series · 16 of 30 positions shown, 20 images · non-contrast
Comparison: None.

CLINICAL DATA: Right hand numbness

EXAM:
CT HEAD WITHOUT CONTRAST
TECHNIQUE: Contiguous axial images were obtained from the base of the skull
through the vertex without intravenous contrast.

[Series 2: head wo · axial · 0.46mm/px · z∈[-91,+44]mm · 16 of 31 slices shown, 20 images]
[im 2/31  brain]
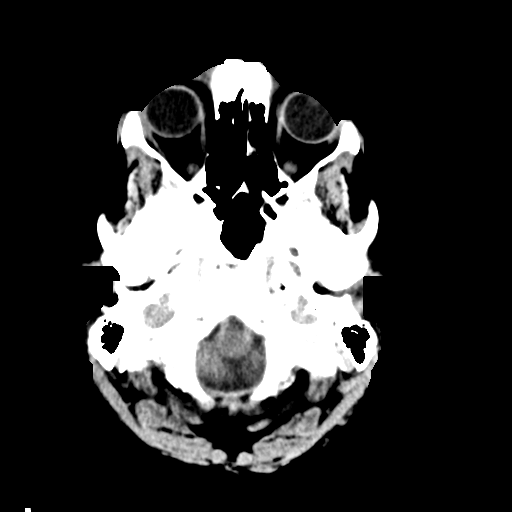
[im 2/31  bone]
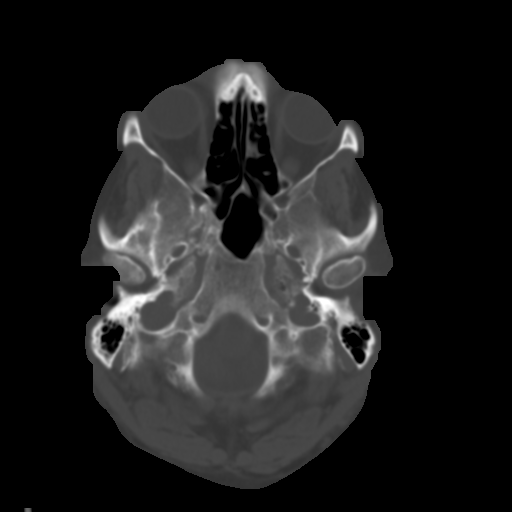
[im 4/31  brain]
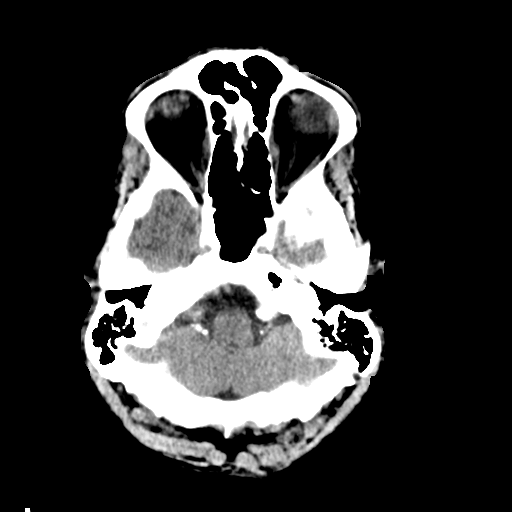
[im 6/31  brain]
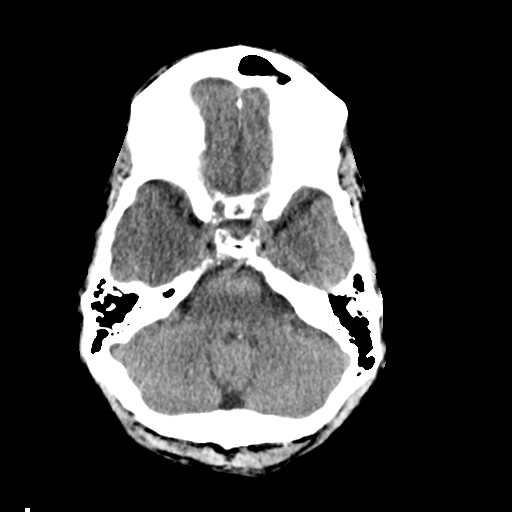
[im 8/31  brain]
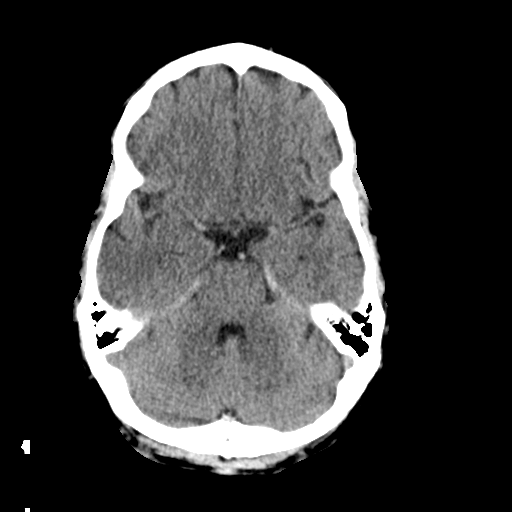
[im 9/31  brain]
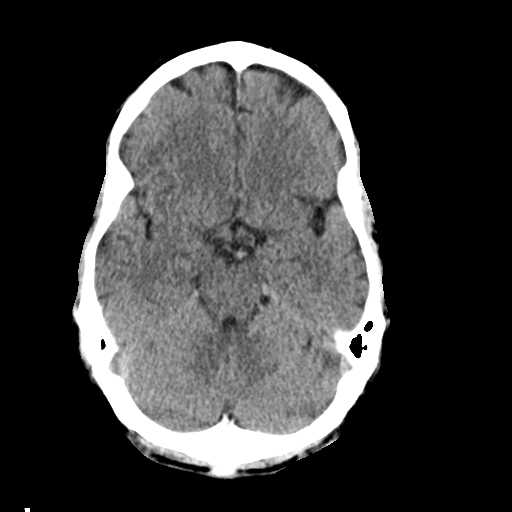
[im 9/31  bone]
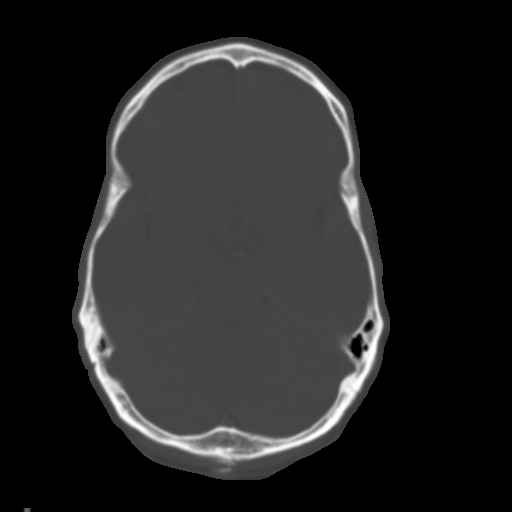
[im 11/31  brain]
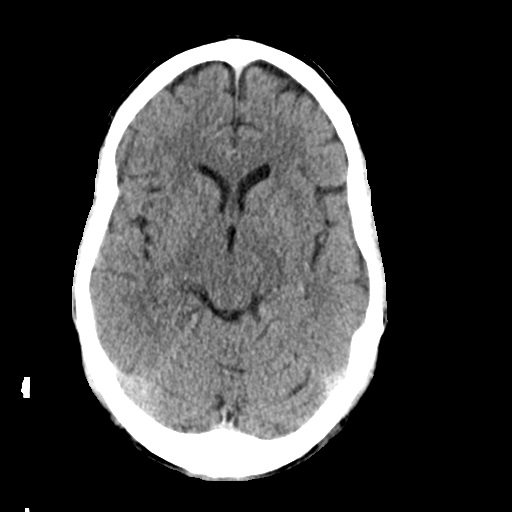
[im 13/31  brain]
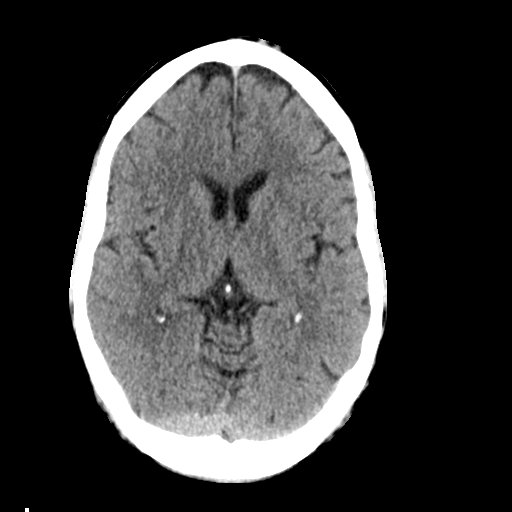
[im 15/31  brain]
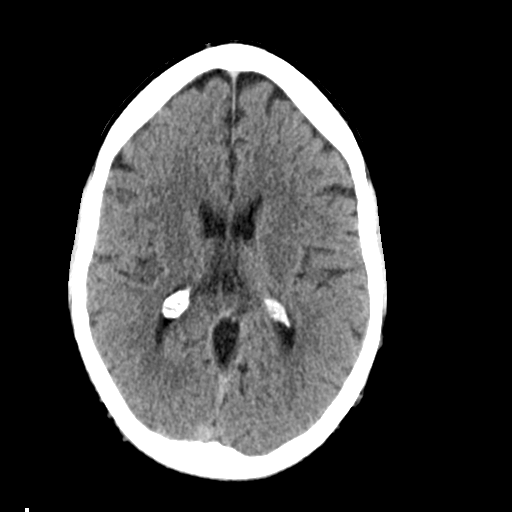
[im 16/31  brain]
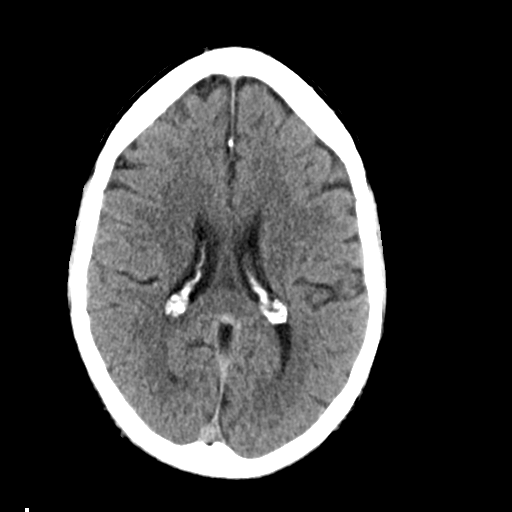
[im 16/31  bone]
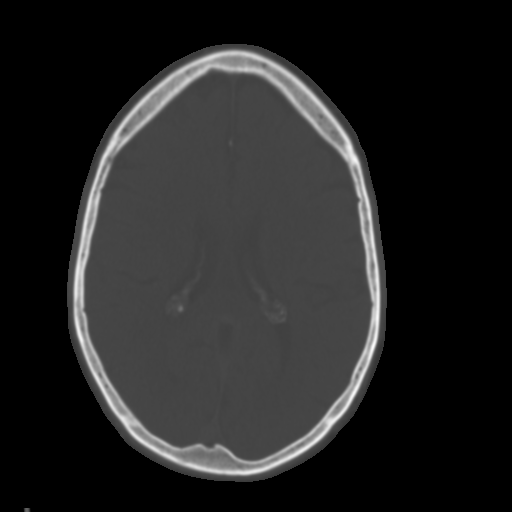
[im 18/31  brain]
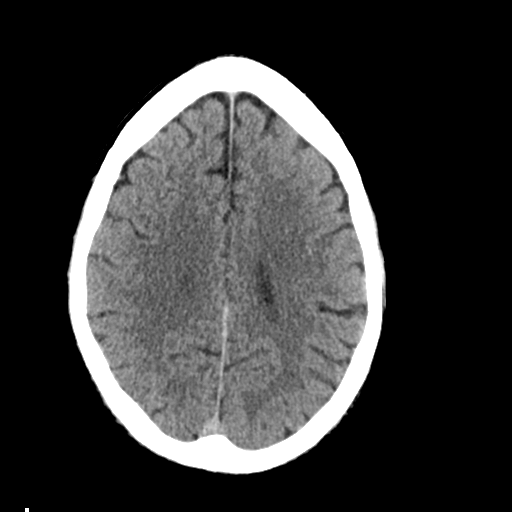
[im 20/31  brain]
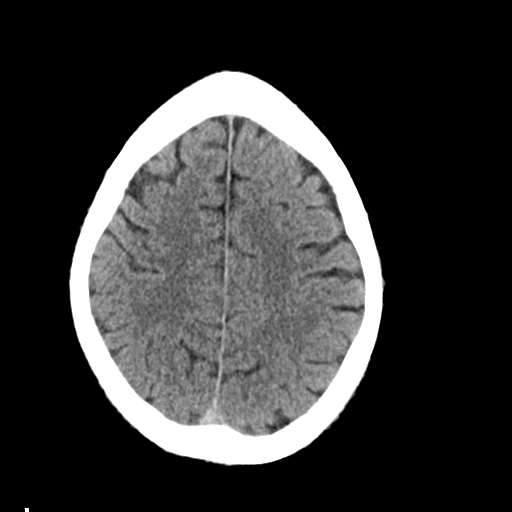
[im 22/31  brain]
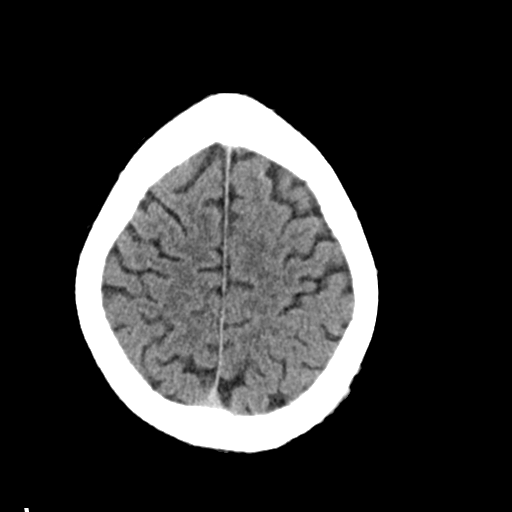
[im 23/31  brain]
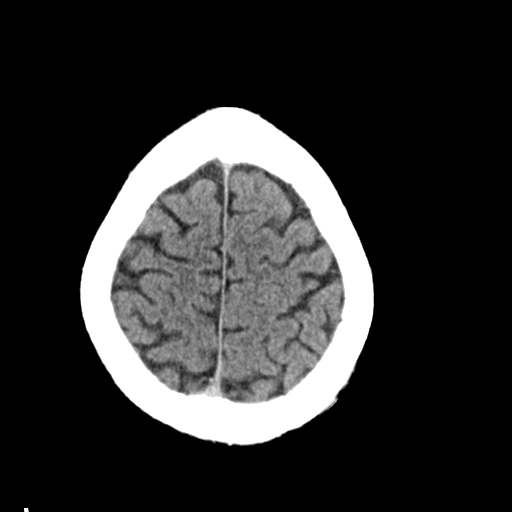
[im 23/31  bone]
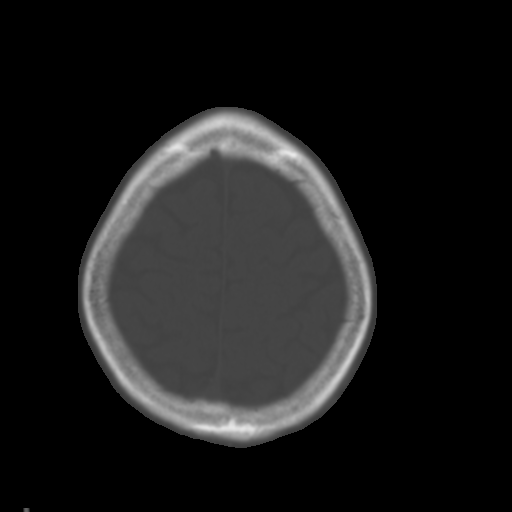
[im 25/31  brain]
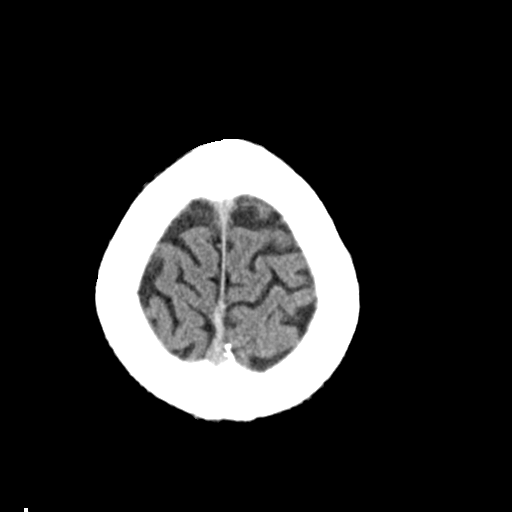
[im 27/31  brain]
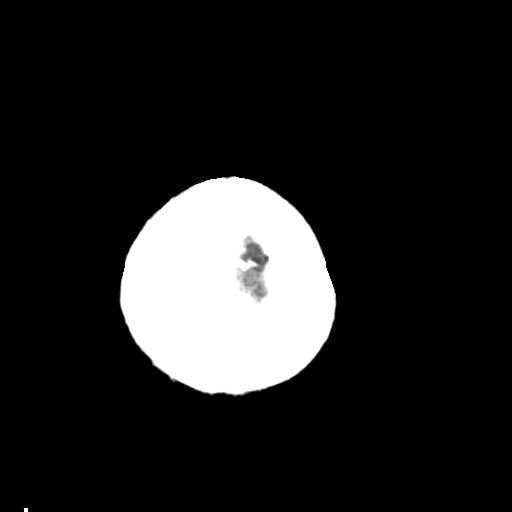
[im 29/31  brain]
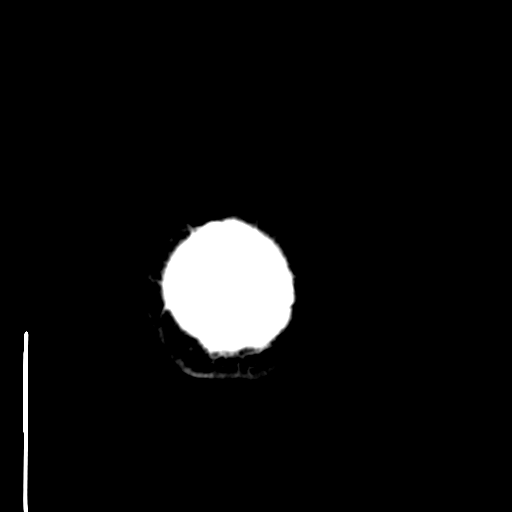

[16 of 30 positions shown; findings below may reference images not displayed]

FINDINGS: Brain: No evidence of acute infarction, hemorrhage, hydrocephalus,
extra-axial collection or mass lesion/mass effect.

Vascular: No hyperdense vessel or unexpected calcification.

Skull: Normal. Negative for fracture or focal lesion.

Sinuses/Orbits: No acute finding.

Other: None
IMPRESSION: Negative non contrasted CT appearance of the brain
